# Patient Record
Sex: Female | Born: 1955 | Hispanic: No | Marital: Married | Smoking: Former smoker
Health system: Southern US, Community
[De-identification: ages and names within clinical notes are randomized; demographics above are authoritative.]

## PROBLEM LIST (undated history)

## (undated) DIAGNOSIS — E785 Hyperlipidemia, unspecified: Secondary | ICD-10-CM

## (undated) DIAGNOSIS — T7840XA Allergy, unspecified, initial encounter: Secondary | ICD-10-CM

## (undated) DIAGNOSIS — K219 Gastro-esophageal reflux disease without esophagitis: Secondary | ICD-10-CM

## (undated) DIAGNOSIS — M1990 Unspecified osteoarthritis, unspecified site: Secondary | ICD-10-CM

## (undated) DIAGNOSIS — M810 Age-related osteoporosis without current pathological fracture: Secondary | ICD-10-CM

## (undated) DIAGNOSIS — R319 Hematuria, unspecified: Secondary | ICD-10-CM

## (undated) DIAGNOSIS — J45909 Unspecified asthma, uncomplicated: Secondary | ICD-10-CM

## (undated) DIAGNOSIS — E559 Vitamin D deficiency, unspecified: Secondary | ICD-10-CM

## (undated) HISTORY — PX: MULTIPLE TOOTH EXTRACTIONS: SHX2053

## (undated) HISTORY — PX: EYE SURGERY: SHX253

## (undated) HISTORY — PX: HAMMER TOE SURGERY: SHX385

## (undated) HISTORY — DX: Unspecified asthma, uncomplicated: J45.909

## (undated) HISTORY — PX: BUNIONECTOMY: SHX129

## (undated) HISTORY — DX: Allergy, unspecified, initial encounter: T78.40XA

## (undated) HISTORY — PX: BREAST CYST EXCISION: SHX579

## (undated) HISTORY — DX: Gastro-esophageal reflux disease without esophagitis: K21.9

## (undated) HISTORY — DX: Unspecified osteoarthritis, unspecified site: M19.90

## (undated) HISTORY — PX: COLONOSCOPY: SHX174

## (undated) HISTORY — DX: Age-related osteoporosis without current pathological fracture: M81.0

## (undated) HISTORY — PX: DILATION AND CURETTAGE OF UTERUS: SHX78

## (undated) HISTORY — PX: RHINOPLASTY: SUR1284

## (undated) HISTORY — DX: Vitamin D deficiency, unspecified: E55.9

## (undated) HISTORY — DX: Hematuria, unspecified: R31.9

## (undated) HISTORY — PX: TONSILECTOMY/ADENOIDECTOMY WITH MYRINGOTOMY: SHX6125

## (undated) HISTORY — DX: Hyperlipidemia, unspecified: E78.5

## (undated) HISTORY — PX: POLYPECTOMY: SHX149

---

## 2009-08-25 ENCOUNTER — Encounter (INDEPENDENT_AMBULATORY_CARE_PROVIDER_SITE_OTHER): Payer: Self-pay | Admitting: *Deleted

## 2009-08-26 ENCOUNTER — Ambulatory Visit: Payer: Self-pay | Admitting: Gastroenterology

## 2009-09-01 ENCOUNTER — Telehealth: Payer: Self-pay | Admitting: Gastroenterology

## 2009-09-02 ENCOUNTER — Ambulatory Visit: Payer: Self-pay | Admitting: Gastroenterology

## 2009-09-04 ENCOUNTER — Encounter: Payer: Self-pay | Admitting: Gastroenterology

## 2009-09-25 ENCOUNTER — Telehealth: Payer: Self-pay | Admitting: Gastroenterology

## 2009-09-25 ENCOUNTER — Ambulatory Visit: Payer: Self-pay | Admitting: Gastroenterology

## 2009-09-25 LAB — CONVERTED CEMR LAB
Fecal Occult Blood: NEGATIVE
OCCULT 1: NEGATIVE
OCCULT 2: NEGATIVE
OCCULT 3: NEGATIVE

## 2009-09-29 ENCOUNTER — Encounter: Payer: Self-pay | Admitting: Gastroenterology

## 2010-04-02 ENCOUNTER — Encounter
Admission: RE | Admit: 2010-04-02 | Discharge: 2010-04-02 | Payer: Self-pay | Source: Home / Self Care | Attending: Family Medicine | Admitting: Family Medicine

## 2010-04-07 NOTE — Progress Notes (Signed)
Summary: prep ?'s  Phone Note Call from Patient Call back at [phone]   Caller: Patient Call For: Dr. Arlyce Dice Reason for Call: Talk to Nurse Summary of Call: prep?'s Initial call taken by: Vallarie Mare,  September 01, 2009 10:03 AM  Follow-up for Phone Call        Returned pts phone call.  She had questions as far as what she could eat after her procedure.  Discussed a non gassy, light diet with her.  Pt. verbalized understanding. Follow-up by: Jennye Boroughs RN,  September 01, 2009 10:10 AM

## 2010-04-07 NOTE — Letter (Signed)
Summary: [facility] Instructions  [facility]  [address] [place], [place] [address]   Phone: [phone]  Fax: [phone]       Connie Lozano    57-Nov-1957    MRN: [id number]        Procedure Day /Date: 09/02/09  Tuesday     Arrival Time:  10:30am      Procedure Time: 11:30am     Location of Procedure:                    _ x_   Endoscopy Center (4th Floor)                        PREPARATION FOR COLONOSCOPY WITH MOVIPREP   Starting 5 days prior to your procedure _ 6/23/11_ do not eat nuts, seeds, popcorn, corn, beans, peas,  salads, or any raw vegetables.  Do not take any fiber supplements (e.g. Metamucil, Citrucel, and Benefiber).  THE DAY BEFORE YOUR PROCEDURE         DATE:  09/01/09  DAY:  Monday  1.  Drink clear liquids the entire day-NO SOLID FOOD  2.  Do not drink anything colored red or purple.  Avoid juices with pulp.  No orange juice.  3.  Drink at least 64 oz. (8 glasses) of fluid/clear liquids during the day to prevent dehydration and help the prep work efficiently.  CLEAR LIQUIDS INCLUDE: Water Jello Ice Popsicles Tea (sugar ok, no milk/cream) Powdered fruit flavored drinks Coffee (sugar ok, no milk/cream) Gatorade Juice: apple, white grape, white cranberry  Lemonade Clear bullion, consomm, broth Carbonated beverages (any kind) Strained chicken noodle soup Hard Candy                             4.  In the morning, mix first dose of MoviPrep solution:    Empty 1 Pouch A and 1 Pouch B into the disposable container    Add lukewarm drinking water to the top line of the container. Mix to dissolve    Refrigerate (mixed solution should be used within 24 hrs)  5.  Begin drinking the prep at 5:00 p.m. The MoviPrep container is divided by 4 marks.   Every 15 minutes drink the solution down to the next mark (approximately 8 oz) until the full liter is complete.   6.  Follow completed prep with 16 oz of clear liquid of your choice  (Nothing red or purple).  Continue to drink clear liquids until bedtime.  7.  Before going to bed, mix second dose of MoviPrep solution:    Empty 1 Pouch A and 1 Pouch B into the disposable container    Add lukewarm drinking water to the top line of the container. Mix to dissolve    Refrigerate  THE DAY OF YOUR PROCEDURE      DATE:  09/02/09  DAY:  Tuesday  Beginning at  6:30 a.m. (5 hours before procedure):         1. Every 15 minutes, drink the solution down to the next mark (approx 8 oz) until the full liter is complete.  2. Follow completed prep with 16 oz. of clear liquid of your choice.    3. You may drink clear liquids until  9:30am (2 HOURS BEFORE PROCEDURE).   MEDICATION INSTRUCTIONS  Unless otherwise instructed, you should take regular prescription medications with a small sip of water  as early as possible the morning of your procedure.   Additional medication instructions: n/a         OTHER INSTRUCTIONS  You will need a responsible adult at least 57 years of age to accompany you and drive you home.   This person must remain in the waiting room during your procedure.  Wear loose fitting clothing that is easily removed.  Leave jewelry and other valuables at home.  However, you may wish to bring a book to read or  an iPod/MP3 player to listen to music as you wait for your procedure to start.  Remove all body piercing jewelry and leave at home.  Total time from sign-in until discharge is approximately 2-3 hours.  You should go home directly after your procedure and rest.  You can resume normal activities the  day after your procedure.  The day of your procedure you should not:   Drive   Make legal decisions   Operate machinery   Drink alcohol   Return to work  You will receive specific instructions about eating, activities and medications before you leave.    The above instructions have been reviewed and explained to me by   Sherren Kerns RN   August 26, 2009 1:41 PM     I fully understand and can verbalize these instructions _____________________________ Date _________

## 2010-04-07 NOTE — Letter (Signed)
Summary: Patient Notice-Hyperplastic Polyps  [facility]  [address] [place], [place] [address]   Phone: [phone]  Fax: [phone]        September 04, 2009 MRN: [id number]    [facility] [address] [place], [place]  [address]    Dear Ms. 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  These types of polyps are NOT pre-cancerous.  It is therefore my recommendation that you have a repeat colonoscopy examination in 10_ years for routine colorectal cancer screening.  Should you develop new or worsening symptoms of abdominal pain, bowel habit changes or bleeding from the rectum or bowels, please schedule an evaluation with either your primary care physician or with me.  Additional information/recommendations:  __No further action with gastroenterology is needed at this time.      Please follow-up with your primary care physician for your other      healthcare needs. __Please call [phone] to schedule a return visit to review      your situation.  __Please keep your follow-up visit as already scheduled.  _x_Continue treatment plan as outlined the day of your exam.  My office will mail you hemeoccult cards to followup on your previous test.  Please call us if you are having persistent problems or have questions about your condition that have not been fully answered at this time.  Sincerely,  Louis Meckel MD This letter has been electronically signed by your physician.  Appended Document: Patient Notice-Hyperplastic Polyps letter mailed 7.6.11

## 2010-04-07 NOTE — Letter (Signed)
Summary: Results Letter  [facility]  [address] [place], [place] [address]   Phone: [phone]  Fax: [phone]        September 29, 2009 MRN: [id number]    [facility] [address] [place], [place]  [address]    Dear Ms. Kidane,  Your hemeoccult cards testing for microscopic bleeding were negative.  No further GI workup is required at this time.   Please continue with the recommendations that we previously discussed. Should you have any further questions or concerns, feel free to contact me.    Sincerely,  Barbette Hair. Arlyce Dice, M.D., [facility]          Sincerely,  Louis Meckel MD  This letter has been electronically signed by your physician.  Appended Document: Results Letter Letter mailed to patient.

## 2010-04-07 NOTE — Progress Notes (Signed)
Summary: Hemoccult Results  Phone Note Call from Patient Call back at Home Phone [phone] Call back at or cell [phone] try home first   Call For: Dr Arlyce Dice Reason for Call: Lab or Test Results Summary of Call: Says she dropped of a stool sample to lab day before yesterday and they told her we would get results the very next day. Is concerned she has not heard anything back yet. Initial call taken by: Leanor Kail [facility],  September 25, 2009 1:00 PM  Follow-up for Phone Call        Pt. advised I will call her when lab has results. Follow-up by: Laureen Ochs LPN,  September 25, 2009 2:31 PM  Additional Follow-up for Phone Call Additional follow up Details #1::        Hemocults are all negative, pt. aware I will call if there are any new orders after Dr.Kaplan reviews. Pt. instructed to call back as needed.  Additional Follow-up by: Laureen Ochs LPN,  September 26, 2009 8:27 AM

## 2010-04-07 NOTE — Procedures (Signed)
Summary: Colonoscopy  Patient: Connie Lozano Note: All result statuses are Final unless otherwise noted.  Tests: (1) Colonoscopy (COL)   COL Colonoscopy           DONE     [facility]     [address]     [place], [place]  [address]           COLONOSCOPY PROCEDURE REPORT           PATIENT:  Connie, Lozano  MR#:  [id number]     BIRTHDATE:  May 16, 1955, 53 yrs. old  GENDER:  female           ENDOSCOPIST:  Barbette Hair. Arlyce Dice, MD     Referred by:           PROCEDURE DATE:  09/02/2009     PROCEDURE:  Colonoscopy with biopsy, Colonoscopy with snare     polypectomy     ASA CLASS:  Class I     INDICATIONS:  1) heme positive stool           MEDICATIONS:   Fentanyl 75 mcg IV, Versed 8 mg IV           DESCRIPTION OF PROCEDURE:   After the risks benefits and     alternatives of the procedure were thoroughly explained, informed     consent was obtained.  Digital rectal exam was performed and     revealed no abnormalities.   The LB [id number]-[id number] endoscope     was introduced through the anus and advanced to the cecum, which     was identified by both the appendix and ileocecal valve, without     limitations.  The quality of the prep was excellent, using     MoviPrep.  The instrument was then slowly withdrawn as the colon     was fully examined.     <<PROCEDUREIMAGES>>           FINDINGS:  A sessile polyp was found in the rectum. It was 3 mm in     size. It was found 2 cm from the point of entry. Polyp was snared     without cautery. Retrieval was successful (see image11). snare     polyp.   Possible colitis was found in the rectum. Inflamed mucosa     involving 2-3cm of rectal vault (see image12). Bxs taken to r/o     procitis vs mucosal prolase  This was otherwise a normal     examination of the colon (see image3, image4, image5, image7, and     image9).   Retroflexed views in the rectum revealed no     abnormalities.    The time to cecum =  6.25  minutes. The scope  was then withdrawn (time =  8.0  min) from the patient and the     procedure completed.           COMPLICATIONS:  None           ENDOSCOPIC IMPRESSION:     1) 3 mm sessile polyp in the rectum     2) Possible colitis in the rectum     3) Otherwise normal examination     RECOMMENDATIONS:     1) Await pathology results           Findings in rectal vault may explain heme positive stool           REPEAT EXAM:   You will receive a letter  from Dr. Arlyce Dice in 1-2     weeks, after reviewing the final pathology, with followup     recommendations.           ______________________________     Barbette Hair Arlyce Dice, MD           cc: Patria Mane, R.N.     [address], [address]     [place], [place] [address]           CC:           n.     eSIGNED:   Barbette Hair. Kaplan at 09/02/2009 12:35 PM           Connie, Lozano, [id number]  Note: An exclamation mark (!) indicates a result that was not dispersed into the flowsheet. Document Creation Date: 09/02/2009 12:35 PM _______________________________________________________________________  (1) Order result status: Final Collection or observation date-time: 09/02/2009 12:27 Requested date-time:  Receipt date-time:  Reported date-time:  Referring Physician:   Ordering Physician: Melvia Heaps [phone]) Specimen Source:  Source: Launa Grill Order Number: [id number] Lab site:   Appended Document: Colonoscopy     Procedures Next Due Date:    Colonoscopy: 09/2019

## 2010-04-07 NOTE — Miscellaneous (Signed)
Summary: previsit/rm  Clinical Lists Changes  Medications: Added new medication of MOVIPREP 100 GM  SOLR (PEG-KCL-NACL-NASULF-NA ASC-C) As per prep instructions. - Signed Rx of MOVIPREP 100 GM  SOLR (PEG-KCL-NACL-NASULF-NA ASC-C) As per prep instructions.;  #1 x 0;  Signed;  Entered by: Sherren Kerns RN;  Authorized by: Louis Meckel MD;  Method used: Electronically to [facility]. [place]. [phone]*, [address] [place], [place], [place]  [address], Ph: [phone] or [phone], Fax: [phone] Observations: Added new observation of ALLERGY REV: Done (08/26/2009 13:07) Added new observation of NKA: T (08/26/2009 13:07)    Prescriptions: MOVIPREP 100 GM  SOLR (PEG-KCL-NACL-NASULF-NA ASC-C) As per prep instructions.  #1 x 0   Entered by:   Sherren Kerns RN   Authorized by:   Louis Meckel MD   Signed by:   Sherren Kerns RN on 08/26/2009   Method used:   Electronically to        [facility]. [address] [phone]* (retail)       [address]       [place], [place]  [address]       Ph: [phone] or [phone]       Fax: [phone]   RxID:   [id number]

## 2011-05-28 ENCOUNTER — Other Ambulatory Visit: Payer: Self-pay | Admitting: Family Medicine

## 2011-05-28 DIAGNOSIS — Z1231 Encounter for screening mammogram for malignant neoplasm of breast: Secondary | ICD-10-CM

## 2011-06-08 ENCOUNTER — Ambulatory Visit
Admission: RE | Admit: 2011-06-08 | Discharge: 2011-06-08 | Disposition: A | Discharge status: Home / Self Care | Payer: BC Managed Care – PPO | Source: Ambulatory Visit | Attending: Family Medicine | Admitting: Family Medicine

## 2011-06-08 DIAGNOSIS — Z1231 Encounter for screening mammogram for malignant neoplasm of breast: Secondary | ICD-10-CM

## 2011-08-18 ENCOUNTER — Other Ambulatory Visit: Payer: Self-pay | Admitting: Lactation Services

## 2011-08-18 ENCOUNTER — Ambulatory Visit
Admission: RE | Admit: 2011-08-18 | Discharge: 2011-08-18 | Disposition: A | Discharge status: Home / Self Care | Payer: BC Managed Care – PPO | Source: Ambulatory Visit | Attending: Family Medicine | Admitting: Family Medicine

## 2011-08-18 DIAGNOSIS — J189 Pneumonia, unspecified organism: Secondary | ICD-10-CM

## 2012-01-19 ENCOUNTER — Ambulatory Visit
Admission: RE | Admit: 2012-01-19 | Discharge: 2012-01-19 | Disposition: A | Discharge status: Home / Self Care | Payer: BC Managed Care – PPO | Source: Ambulatory Visit | Attending: Family Medicine | Admitting: Family Medicine

## 2012-01-19 ENCOUNTER — Other Ambulatory Visit: Payer: Self-pay | Admitting: Family Medicine

## 2012-01-19 DIAGNOSIS — M542 Cervicalgia: Secondary | ICD-10-CM

## 2012-01-19 DIAGNOSIS — R200 Anesthesia of skin: Secondary | ICD-10-CM

## 2012-03-27 ENCOUNTER — Other Ambulatory Visit (HOSPITAL_COMMUNITY)
Admission: RE | Admit: 2012-03-27 | Discharge: 2012-03-27 | Disposition: A | Discharge status: Home / Self Care | Payer: BC Managed Care – PPO | Source: Ambulatory Visit | Attending: Family Medicine | Admitting: Family Medicine

## 2012-03-27 ENCOUNTER — Other Ambulatory Visit: Payer: Self-pay | Admitting: Family Medicine

## 2012-03-27 DIAGNOSIS — Z124 Encounter for screening for malignant neoplasm of cervix: Secondary | ICD-10-CM | POA: Insufficient documentation

## 2012-06-26 ENCOUNTER — Other Ambulatory Visit: Payer: Self-pay

## 2012-06-26 DIAGNOSIS — Z1231 Encounter for screening mammogram for malignant neoplasm of breast: Secondary | ICD-10-CM

## 2012-07-18 ENCOUNTER — Other Ambulatory Visit: Payer: Self-pay | Admitting: Dermatology

## 2012-07-25 ENCOUNTER — Ambulatory Visit
Admission: RE | Admit: 2012-07-25 | Discharge: 2012-07-25 | Disposition: A | Discharge status: Home / Self Care | Payer: BC Managed Care – PPO | Source: Ambulatory Visit

## 2012-07-25 DIAGNOSIS — Z1231 Encounter for screening mammogram for malignant neoplasm of breast: Secondary | ICD-10-CM

## 2012-08-16 ENCOUNTER — Other Ambulatory Visit: Payer: Self-pay | Admitting: Dermatology

## 2012-09-19 ENCOUNTER — Other Ambulatory Visit: Payer: Self-pay | Admitting: Dermatology

## 2013-03-30 ENCOUNTER — Other Ambulatory Visit (HOSPITAL_COMMUNITY)
Admission: RE | Admit: 2013-03-30 | Discharge: 2013-03-30 | Disposition: A | Discharge status: Home / Self Care | Payer: BC Managed Care – PPO | Source: Ambulatory Visit | Attending: Family Medicine | Admitting: Family Medicine

## 2013-03-30 ENCOUNTER — Other Ambulatory Visit: Payer: Self-pay | Admitting: Family Medicine

## 2013-03-30 DIAGNOSIS — Z124 Encounter for screening for malignant neoplasm of cervix: Secondary | ICD-10-CM | POA: Insufficient documentation

## 2013-03-30 DIAGNOSIS — Z1151 Encounter for screening for human papillomavirus (HPV): Secondary | ICD-10-CM | POA: Insufficient documentation

## 2013-06-21 ENCOUNTER — Other Ambulatory Visit: Payer: Self-pay

## 2013-06-21 DIAGNOSIS — Z1231 Encounter for screening mammogram for malignant neoplasm of breast: Secondary | ICD-10-CM

## 2013-06-22 ENCOUNTER — Ambulatory Visit: Payer: Self-pay | Admitting: Family Medicine

## 2013-06-25 ENCOUNTER — Ambulatory Visit: Payer: Self-pay | Admitting: Family Medicine

## 2013-06-25 ENCOUNTER — Ambulatory Visit: Payer: BC Managed Care – PPO | Admitting: Family Medicine

## 2013-08-22 ENCOUNTER — Ambulatory Visit
Admission: RE | Admit: 2013-08-22 | Discharge: 2013-08-22 | Disposition: A | Discharge status: Home / Self Care | Payer: BC Managed Care – PPO | Source: Ambulatory Visit

## 2013-08-22 ENCOUNTER — Encounter (INDEPENDENT_AMBULATORY_CARE_PROVIDER_SITE_OTHER): Payer: Self-pay

## 2013-08-22 DIAGNOSIS — Z1231 Encounter for screening mammogram for malignant neoplasm of breast: Secondary | ICD-10-CM

## 2013-08-24 ENCOUNTER — Encounter: Payer: Self-pay | Admitting: Family Medicine

## 2013-08-24 ENCOUNTER — Ambulatory Visit (INDEPENDENT_AMBULATORY_CARE_PROVIDER_SITE_OTHER): Payer: BC Managed Care – PPO | Admitting: Family Medicine

## 2013-08-24 VITALS — BP 131/85 | Ht 62.0 in

## 2013-08-24 DIAGNOSIS — M545 Low back pain, unspecified: Secondary | ICD-10-CM | POA: Insufficient documentation

## 2013-08-24 DIAGNOSIS — M810 Age-related osteoporosis without current pathological fracture: Secondary | ICD-10-CM | POA: Insufficient documentation

## 2013-08-24 NOTE — Progress Notes (Signed)
Subjective:  Patient presents today as a new patient to discuss chronic mild low back pain with activity.   Low Back Pain Patient's main concern today is to find exercises that will not cause severe injury given that she has osteoporosis and is concerned may have some osteoarthritis.  Location- lumbar spine Quality-aching pain Severity- mild less than 4/10 at worst Duration- x 10 years Timing- denies history of trauma Context- Patient is very active. She participates in pilates, jogging, lifting weights, jogging typically 3x a week in summer and 1-3x a week in school year. . SHe usually has pain after prolonged activity. IT usually resolves with rest.  Modifying factors- feels stiff in morning or if sitting for long period. <5 minutes of stiffness with moving. Pain can be aggravated by long periods of sitting as well Previous treatments-does not want to take medication for this, has seen a chiropractor or tried ice/heat. She admits to avoiding back extension exercises for fear of damage to osteoporosis.   ROS- no fever/chills/unintentional weight loss/fatigue. No saddle anesthesia, bladder incontinence, fecal incontinence, weakness in extremity, numbness or tingling in extremity. History negative for trauma, history of cancer, fever, chills, unintentional weight loss, recent bacterial infection, recent IV drug use, HIV, pain worse at night or while supine. She does occasionally have some pain above right illiac spine after at least a mile of walking but also relieves with rest going on for 2 months. Also has some history of trapezius strain for which she saw a chiropractor  The following were reviewed and entered/updated in epic: Past Medical History  Diagnosis Date  . Osteoporosis    Past Surgical History  Procedure Laterality Date  . Eye surgery      strabismus  . Tonsilectomy/adenoidectomy with myringotomy    . Hammer toe surgery    . Bunionectomy     Medications- reviewed and  updated Current Outpatient Prescriptions  Medication Sig Dispense Refill  . acyclovir ointment (ZOVIRAX) 5 % Apply 1 application topically every 3 (three) hours.      . Calcium-Vitamin D-Vitamin K [id number] MG-UNT-MCG CHEW Chew by mouth.      . fluticasone (FLONASE) 50 MCG/ACT nasal spray Place into both nostrils daily.      . hydrocortisone (ANUSOL-HC) 25 MG suppository Place 25 mg rectally 2 (two) times daily.      Marland Kitchen ibuprofen (ADVIL,MOTRIN) 200 MG tablet Take 200 mg by mouth every 6 (six) hours as needed.      . loratadine (CLARITIN) 10 MG tablet Take 10 mg by mouth daily.      . Lubricants (ASTROGLIDE) GEL Apply topically.      . ondansetron (ZOFRAN) 4 MG tablet Take 4 mg by mouth every 8 (eight) hours as needed for nausea or vomiting.      . Salicylic Acid (COMPOUND W EX) Apply topically.      . Sinecatechins (VEREGEN) 15 % OINT Apply topically.      . Vitamin D, Ergocalciferol, (DRISDOL) 50000 UNITS CAPS capsule Take 50,000 Units by mouth every 7 (seven) days.       No current facility-administered medications for this visit.    Allergies-reviewed and updated Allergies no known allergies  History   Social History  . Marital Status: Married    Spouse Name: N/A    Number of Children: N/A  . Years of Education: N/A   Social History Main Topics  . Smoking status: Former Smoker -- 0.50 packs/day for 30 years  . Smokeless tobacco: None  .  Alcohol Use: 0.6 oz/week    1 Glasses of wine per week  . Drug Use: None  . Sexual Activity: None   Other Topics Concern  . None   Social History Narrative   Works as Multimedia programmer in [employer]   ROS--See HPI   Objective: BP 131/85  Ht 5\' 2"  (1.575 m) Gen: thin female appears younger than stated age, NAD  Neck: Cannot reproduce tenderness Negative spurlings  Bilateral Hip: ROM IR: 80 Deg, ER: 80 Deg, Flexion: 120 Deg, Extension: 100 Deg, Abduction: 45 Deg, Adduction: 45 Deg Strength IR: 5/5, ER: 5/5, Flexion: 5/5,  Extension: 5/5, Abduction: 4+/5, Adduction: 5/5 Pelvic alignment unremarkable to inspection and palpation. Standing hip rotation and gait without trendelenburg / unsteadiness. Greater trochanter without tenderness to palpation. No tenderness over piriformis and greater trochanter. No SI joint tenderness and normal minimal SI movement. No abnormalities of gait noted   Back - Normal skin, Spine with normal alignment and no deformity.  No tenderness to vertebral process palpation.  Paraspinous muscles are not tender and without spasm.   Range of motion is full at neck and lumbar sacral regions. Negative straight leg raise.   Assessment/Plan:  Low back pain Nonradicular. Patient with mild low back pain that she wants to improve without aid of medication. Her main concern was causing injury due to her osteoporosis-she was advised to avoid high impact or very high weight exercises. Did encourage weight bearing exercises. Advised no restrictions unless something causes her obvious uncomfortable pain (beyond what you would expect working out). DId advise of a core strengthening program specifically with supermans to counteract typical flexion of back in our lifestyle (can use other exercises as well and also discussed weak hip abductors which she can work on). Patient also requests advise on someone in area for manipulation. Patient was given contact information for Charlann Boxer, DO of [place] who she will seek care for her neck and low back. She was advised to return to [place] prn.    Dr. Nori Riis has seen and evaluated the patient independently. We have discussed the history, exam, assessment, and plan as noted above. She agrees with management.   Brayton Mars. Melanee Spry, MD, PGY3 08/24/2013 3:18 PM

## 2013-08-24 NOTE — Assessment & Plan Note (Signed)
Nonradicular. Patient with mild low back pain that she wants to improve without aid of medication. Her main concern was causing injury due to her osteoporosis-she was advised to avoid high impact or very high weight exercises. Did encourage weight bearing exercises. Advised no restrictions unless something causes her obvious uncomfortable pain (beyond what you would expect working out). DId advise of a core strengthening program specifically with supermans to counteract typical flexion of back in our lifestyle (can use other exercises as well and also discussed weak hip abductors which she can work on). Patient also requests advise on someone in area for manipulation. Patient was given contact information for Charlann Boxer, DO of [place] who she will seek care for her neck and low back. She was advised to return to [place] prn.

## 2013-08-28 NOTE — Progress Notes (Signed)
[facility] Attending Note: I have seen and examined this patient. I have discussed this patient with the resident and reviewed the assessment and plan as documented above. I agree with the resident's findings and plan.  

## 2014-07-08 ENCOUNTER — Encounter: Payer: Self-pay | Admitting: Gastroenterology

## 2014-07-23 ENCOUNTER — Other Ambulatory Visit: Payer: Self-pay

## 2014-07-23 DIAGNOSIS — Z1231 Encounter for screening mammogram for malignant neoplasm of breast: Secondary | ICD-10-CM

## 2014-08-27 ENCOUNTER — Ambulatory Visit: Payer: Self-pay

## 2014-10-01 ENCOUNTER — Ambulatory Visit
Admission: RE | Admit: 2014-10-01 | Discharge: 2014-10-01 | Disposition: A | Discharge status: Home / Self Care | Payer: BLUE CROSS/BLUE SHIELD | Source: Ambulatory Visit

## 2014-10-01 DIAGNOSIS — Z1231 Encounter for screening mammogram for malignant neoplasm of breast: Secondary | ICD-10-CM

## 2015-03-11 ENCOUNTER — Ambulatory Visit: Payer: BLUE CROSS/BLUE SHIELD

## 2015-03-20 ENCOUNTER — Encounter: Payer: Self-pay | Admitting: Physical Therapy

## 2015-03-20 ENCOUNTER — Ambulatory Visit: Payer: BLUE CROSS/BLUE SHIELD | Attending: Internal Medicine | Admitting: Physical Therapy

## 2015-03-20 DIAGNOSIS — R531 Weakness: Secondary | ICD-10-CM | POA: Diagnosis present

## 2015-03-20 DIAGNOSIS — M545 Low back pain, unspecified: Secondary | ICD-10-CM

## 2015-03-20 NOTE — Patient Instructions (Addendum)
Osteoporosis   What is Osteoporosis?  - A silent disease in which the skeleton is weakened by decreased bone density. - Characterized by low bone mass, deterioration of bone, and increased risk of fracture postmenopausal (primary) or the result of an identifiable condition/event (secondary) - Commonly found in the wrists, spine, and hips; these are high-risk stress areas and very susceptible to fractures.  The Facts: - There are 1.5 million fractures/year o 500,000 spine; 250,000 hip with over 60,000 nursing home admissions secondary to hip fracture; and 200,000 wrist - After hip fracture, only 50% of people able to walk independently prior to the fracture return to independent ambulation. - Bone mass: Peaks at age 8-30, and begins declining at age 64-50.   Osteoporosis is defined by the [facility]) as:  NOF/WHO Criteria for Interpreting Results of Bone Density Assessment  Results Diagnosis  Within 1 standard deviation (SD) of young adult mean Normal  Between 1 and -2.5 SD below mean, repeat in 2 years Low bone mass (osteopenia)  Greater than -2.5 SD below mean Osteoporosis  Greater than -2.5 SD below mean and one or more fragility fractures exist Severe Osteoporosis  *Results can be affected by positioning of the body in the DEXA scan, presence of current or old fractures, arthritis, extraneous calcifications.   Hips -2.3, and L1 -4.1 Osteoporosis is not just a women's disease!  - 30-40% of women will develop osteoporosis - 5-15% of males will develop osteoporosis   What are the risk factors?  1. Female 2. Thin, small frame 3. Caucasian, Asian race 4. Early menopause (<86 years old)/amenorrhea/delayed puberty 33. Old age 27. Family history (fractures, stooped posture)\ 7. Low calcium diet 8. Sedentary lifestyle 9. Alcohol, Caffeine, Smoking 10. Malnutrition, GI Disease 11. Prolonged use of Glucocorticoids (Prednisone), Meds to treat asthma,  arthritis, cancers, thyroid, and anti-seizure meds.  How do I know for sure?  Get a BONE DENSITY TEST!  This measures bone loss and it's painless, non-invasive, and only takes 5-10 minutes!  What can I do about it?  ? Decrease your risk factors (alcohol, caffeine, smoking) ? Helpful medications (see next page) ? Adequate Calcium and Vitamin D intake ? Get active! o Proper posture - Sit and stand tall! No slouching or twisting o Weight-Bearing Exercise - walking, stair climbing, elliptical; NO jogging or high-impact exercise. o Resistive Exercise - Cybex weight equipment, Nautilus, dumbbells, therabands  **Be sure to maintain proper alignment when lifting any weight!!  **When using equipment, avoid abdominal exercises which involve "crunching" or curling or twisting the trunk, biceps machines, cross-country machines, moving handlebars, or ANY MACHINE WITH ROTATION OR FORWARD BENDING!!!           Approved Pharmacologic Management of Osteoporosis  Agent Approved for prevention Approved for treatment BMD increased spine/hip Fracture reduction  Estrogen/Hormone Therapy (Estrace, Estratab, Ogen, Premarin, Vivell, Prempro, Femhert, Orthoest) Yes Yes 3-6% 35% spine and hip  Bisphosphonates  (Fosamax, Actonel, Boniva) Yes Yes 3-8% 35-50% spine and non-spine  Calcitonin (Miacalcin, Calcimar, Fortical) No Yes 0-3% None stated  Raloxifene (Evista) Yes Yes 2-3% 30-55%  Parathyroid Hormone (Forteo) No Yes, only in those at high risk for fracture None stated 53-65%     Recommended Daily Calcium Intakes   Population Group NIH/NOF* (mg elemental calcium)  Children 1-10 years [phone]  Children 11-24 years 73-1500  Men and Women 25-64 years At least 1200  Pregnant/Lactating At least 1200  Postmenopausal women with hormone replacement therapy At least 1200  Postmenopausal  women without   hormone replacement therapy At least 1200  Men and women 65 + At least 1200  *In 1987,  1990, 1994, and 2000, the NIH held consensus conferences on osteoporosis and calcium.  This column shows the most recent recommendations regarding calcium intak for preventing and managing osteoporosis.          Calcium Content of Selected Foods  Dairy Foods Calcium Content (mg) Non-Dairy Foods Calcium Content (mg)  Buttermilk, 1 cup 300 Calcium-fortified juice, 1 cup 300  Milk, 1 cup 300 Salmon, canned with Bones, 2 oz 100  Lactaid milk, 1 cup 300-500 Oysters, raw 13-19 medium 226  Soy milk, 1 cup 200-300 Sardines, canned with bones, 3 oz 372  Yogurt (plain, lowfat) 1 cup 250-300 Shrimp, canned 3 oz 98  Frozen yogurt (fruit) 1 cup 200-600 Collard greens, cooked 1 cup 357  Cheddar, mozzarella, or Muenster cheese, 1oz 205 Broccoli, cooked 1 cup 78  Cottage cheese (lowfat) 4 oz 200 Soybeans, cooked 1 cup 131  Part-skim ricotta cheese, 4oz 335 Tofu, 4oz* *  Vanilla ice cream, 1 cup 120-300    *Calcium content of tofu varies depending on processing method; check nutritional label on package for precise calcium content.     Suggested Guidelines for Calcium Supplement Use:  ? Calcium is absorbed most efficiently if taken in small amounts throughout the day.  Always divide the daily dose into smaller amounts if the total daily dose is 500mg  or more per day.  The body cannot use more than 500mg  Calcium at any one time. ? The use of manufactured supplements is encouraged.  Calcium as bone meal or dolomite may contain lead or other heavy metals as contaminants. ? Calcium supplements should not be taken with high fiber meals or with bulk forming laxatives. ? If calcium carbonate is used as the supplement form, it should be taken with meals to assure that stomach acid production is present to facilitate optimal dissolution and absorption of calcium.  This is important if atrophic gastritis with hypo- or achlorhydria is present, which it is in 20-50% of older individuals. ? It is  important to drink plenty of fluids while using the supplement to help reduce problems with side effects like constipation or bloating.  If these symptoms become a problem, switching to another form of supplement may be the answer. ? Another alternative is calcium-fortified foods, including fruit juices, cereals, and breads.  These foods are now marketed with added calcium and may be less likely to cause side effects. ? Those with personal or family histories of kidney stones should be monitored to assure that hypercalcuria does not occur. CALCIUM INTAKE QUIZ  Dairy products are the primary source of calcium for most people.  For a quick estimate of your daily calcium intake, complete the following steps:  1. Use the chart below to determine your daily intake of calcium from diary foods. Servings of dairy per day 1 2 3 4 5 6 7 8   Milligrams (mg) of calcium: 250 [phone] 1250 1500 1750 2000   2.  Enter your total daily calcium intake from dairy foods:     _____mg  3.  Add 350 mg, which is the average for all other dietary sources:                 +            350 mg  4.  The sum of your total daily calcium intake:  ______mg  5.  Enter the recommended calcium intake for your age from the chart below;         ______mg  6.  Enter your daily intake from step 4 above and subtract:                             -        _______mg  7.  The result is how much additional calcium you need:                                          ______mg      Recommended Daily Calcium Intake  Population Calcium (mg)  Children 1-10 years [phone]  Children 11-24 years 41-1500  Men and women 25-64 At least 1200  Pregnant/Lactating At least 1200  Postmenopausal women with hormone replacement therapy At least 1200  Postmenopausal women without hormone replacement therapy At least 1200  Men and women 65+ At least 1200       SAFETY TIPS FOR FALL PREVENTION   1. Remove throw rugs and make  certain carpet edges are securely fastened to the floor.  2. Reduce clutter, especially in traffic areas of the home.   3. Install/maintain sturdy handrails at stairs.  4. Increase wattage of lighting in hallways, bathrooms, kitchens, stairwells, and entrances to home.  5. Use night-lights near bed, in hallways, and in bathroom to improve night safety.  6. Install safety handrails in shower, tub, and around toilet.  Bathtubs and shower stalls should have non-skid surfaces.  7. When you must reach for something high, use a safety step stool, one with wide steps and a friction surface to stand on.  A type equipped with a high handrail is preferred.  8. If a cane or other walking aid has been recommended, use it to help increase your stability.  9. Wear supportive, cushioned, low-heeled shoes.  Avoid "scuffs" (backless bedroom slippers) and high heels.  10. Avoid rushing to answer a phone, doorbell, or anything else!  A portable phone that you can take from room to room with you is a good idea for security and safety.  11. Exercise regularly and stay active!!    [place] [employer] www.NOF.org   Exercise for Osteoporosis; A Safe and Effective Way to Build Bone Density and Muscle Strength By: Burnard Hawthorne, M.A.                                            DO's and DON'T's   Avoid and/or Minimize positions of forward bending ( flexion)  Side bending and rotation of the trunk  Especially when movements occur together   When your back aches:   Don't sit down   Lie down on your back with a small pillow under your head and one under your knees or as outlined by our therapist. Or, lie in the 90/90 position ( on the floor with your feet and legs on the sofa with knees and hips bent to 90 degrees)  Tying or putting on your shoes:   Don't bend over to tie your shoes or put on socks.  Instead, bring one foot up, cross it over the opposite knee and bend  forward (hinge) at the hips to so the task.  Keep your back straight.  If you cannot do this safely, then you need to use long handled assistive devices such as a shoehorn and sock puller.  Exercising:  Don't engage in ballistic types of exercise routines such as high-impact aerobics or jumping rope  Don't do exercises in the gym that bring you forward (abdominal crunches, sit-ups, touching your  toes, knee-to-chest, straight leg raising.)  Follow a regular exercise program that includes a variety of different weight-bearing activities, such as low-impact aerobics, T' ai chi or walking as your physical therapist advises  Do exercises that emphasize return to normal body alignment and strengthening of the muscles that keep your back straight, as outlined in this program or by your therapist  Household tasks:  Don't reach unnecessarily or twist your trunk when mopping, sweeping, vacuuming, raking, making beds, weeding gardens, getting objects ou of cupboards, etc.  Keep your broom, mop, vacuum, or rake close to you and mover your whole body as you move them. Walk over to the area on which you are working. Arrange kitchen, bathroom, and bedroom shelves so that frequently used items may be reached without excessive bending, twisting, and reaching.  Use a sturdy stool if necessary.  Don't bend from the waist to pick up something up  Off the floor, out of the trunk of your car, or to brush your teeth, wash your face, etc.   Bend at the knees, keeping back straight as possible. Use a reacher if necessary.   Prevention of fracture is the so-called "BOTTOm -Line" in the management of OSTEOPOROSIS. Do not take unnecessary chances in movement. Once a compression fracture occurs, the process is very difficult to control; one fracture is frequently followed by many more.  [facility] [address], [place], [place] [address] Phone # [phone] Fax [phone]

## 2015-03-20 NOTE — Therapy (Signed)
[facility] [address] [place], [place], [address] Phone: [phone]   Fax:  [phone]  Physical Therapy Treatment  Patient Details  Name: Connie Lozano MRN: [id number] Date of Birth: Feb 14, 1956 Referring Provider: Dr. Delrae Rend  Encounter Date: 03/20/2015      PT End of Session - 03/20/15 1631    Visit Number 1   Date for PT Re-Evaluation 05/15/15   PT Start Time 1530   PT Stop Time 1615   PT Time Calculation (min) 45 min   Activity Tolerance Patient limited by pain   Behavior During Therapy [facility] for tasks assessed/performed      Past Medical History  Diagnosis Date  . Osteoporosis     Past Surgical History  Procedure Laterality Date  . Eye surgery      strabismus  . Tonsilectomy/adenoidectomy with myringotomy    . Hammer toe surgery    . Bunionectomy      There were no vitals filed for this visit.  Visit Diagnosis:  Bilateral low back pain without sciatica - Plan: PT plan of care cert/re-cert  Weakness - Plan: PT plan of care cert/re-cert      Subjective Assessment - 03/20/15 1541    Subjective Bone Density shows L1 is -4.1, femural neck -2.4 .  Patient wants to learn what she is to do for osteoporosis, how to manage it, work with back pain. Patient has had back pain for 10 years that has become progressively worse.  Patient was diagnosed with osteoporsis 7 years ago.    Patient Stated Goals education of osteoporosis   Currently in Pain? Yes   Pain Score 5    Pain Location Back   Pain Orientation Mid   Pain Descriptors / Indicators Sore   Pain Type Chronic pain   Pain Onset More than a month ago   Pain Frequency Intermittent   Aggravating Factors  vacuuming, moving trunk back and forth, lifting items   Pain Relieving Factors heat, stretching   Multiple Pain Sites No            [facility] PT Assessment - 03/20/15 0001    Assessment   Medical Diagnosis osteoporosis   Referring  Provider Dr. Delrae Rend   Onset Date/Surgical Date 03/08/08   Prior Therapy None   Balance Screen   Has the patient fallen in the past 6 months No   Has the patient had a decrease in activity level because of a fear of falling?  No   Is the patient reluctant to leave their home because of a fear of falling?  No   Prior Function   Level of Independence Independent   Vocation Full time employment   Vocation Requirements standing, bending over   Cognition   Overall Cognitive Status Within Functional Limits for tasks assessed   Observation/Other Assessments   Focus on Therapeutic Outcomes (FOTO)  34% limitation CJ   Posture/Postural Control   Posture Comments Height is 60 inches   ROM / Strength   AROM / PROM / Strength AROM;Strength   AROM   Lumbar Extension decreased by 25%   Lumbar - Right Side Bend decreased by 25%   Lumbar - Left Side Bend decreased by 25%   Strength   Overall Strength Comments bil. hip strength is 4/5. bil. shoulder strength 4/5, abdominal strength is 3/5   Standardized Balance Assessment   Five times sit to stand comments  13 seconds  PT Education - 03/20/15 1705    Education provided Yes   Education Details information on osteoporosis for do's and don'ts   Person(s) Educated Patient   Methods Explanation;Handout   Comprehension Verbalized understanding          PT Short Term Goals - 03/20/15 1708    PT SHORT TERM GOAL #1   Title understand how to manage osteoporosis to decrease pain and risk of fractures   Time 4   Period Weeks   Status New   PT SHORT TERM GOAL #2   Title independent with initial HEP   Time 4   Period Weeks   Status New   PT SHORT TERM GOAL #3   Title understand correct body mechanics with daily tasks to decrease fracture risk   Time 4   Period Weeks   Status New           PT Long Term Goals - 03/20/15 1710    PT LONG TERM GOAL #1   Title independent with HEP that  includes working out at the gym   Time 8   Period Weeks   Status New   PT LONG TERM GOAL #2   Title lumbar pain with daily tasks decreased >/= 50% due to increased lumbar strength   Time 8   Period Weeks   Status New   PT LONG TERM GOAL #3   Title work with back pain decreased >/= 50% due to improved strength for hips and shoulders >/= 4+/5   Time 8   Period Weeks   Status New   PT LONG TERM GOAL #4   Title understand ways to exercise to increase bone mass without putting strain on lumbar spine   Time 8   Period Weeks   Status New               Plan - 03/20/15 1613    Clinical Impression Statement Patient is a 60 year old with diagnosis of osteoporosis and medicare. Patient has had back pain for 10 years and diagnosis of osteoporosis is 7 years.  Patients t-score is -4.1 for L1 and hips are -2.3.  Patient reports her back pain 5/10 with  forward movement and laying on her back.  Patient bil. shoulder and hip strength is 4/5.  Patient height is 60 inches.  FOTO score is 34% limitation. Patient does not know how to exercise or manage her pain  correctly to prevent any fractures.  Patient does not understand how to exercise to increase her bone mass. Patient would benefit from physical therapy to reduce back pain, edcation, and how to exercies correctly.    Pt will benefit from skilled therapeutic intervention in order to improve on the following deficits Pain;Decreased strength;Decreased endurance   Rehab Potential Excellent   Clinical Impairments Affecting Rehab Potential None   PT Frequency 1x / week   PT Duration 8 weeks   PT Treatment/Interventions Therapeutic activities;Therapeutic exercise;Moist Heat;Electrical Stimulation;Cryotherapy;Ultrasound;Manual techniques;Patient/family education;Neuromuscular re-education   PT Next Visit Plan decompression exercises, correct posture and body mechanics with daily tasks   PT Home Exercise Plan decompression exercises   Recommended  Other Services None   Consulted and Agree with Plan of Care Patient        Problem List Patient Active Problem List   Diagnosis Date Noted  . Osteoporosis, unspecified 08/24/2013  . Low back pain 08/24/2013    Earlie Counts, PT 03/20/2015 5:16 PM   [facility] [address]  [address] [place], [place], [address] Phone: [phone]   Fax:  [phone]  Name: Connie Lozano MRN: [id number] Date of Birth: August 19, 1955

## 2015-03-24 ENCOUNTER — Ambulatory Visit: Payer: BLUE CROSS/BLUE SHIELD | Admitting: Physical Therapy

## 2015-03-24 ENCOUNTER — Encounter: Payer: Self-pay | Admitting: Physical Therapy

## 2015-03-24 DIAGNOSIS — M545 Low back pain, unspecified: Secondary | ICD-10-CM

## 2015-03-24 DIAGNOSIS — R531 Weakness: Secondary | ICD-10-CM

## 2015-03-24 NOTE — Therapy (Signed)
[facility] [address], [place], [place], [address] Phone: [phone]   Fax:  [phone]  Physical Therapy Treatment  Patient Details  Name: Niela Raible MRN: [id number] Date of Birth: October 25, 1955 Referring Provider: Dr. Delrae Rend  Encounter Date: 03/24/2015      PT End of Session - 03/24/15 1237    Visit Number 2   Date for PT Re-Evaluation 05/15/15   PT Start Time 1145   PT Stop Time 1230   PT Time Calculation (min) 45 min   Activity Tolerance Patient limited by pain   Behavior During Therapy [facility] for tasks assessed/performed      Past Medical History  Diagnosis Date  . Osteoporosis     Past Surgical History  Procedure Laterality Date  . Eye surgery      strabismus  . Tonsilectomy/adenoidectomy with myringotomy    . Hammer toe surgery    . Bunionectomy      There were no vitals filed for this visit.  Visit Diagnosis:  Bilateral low back pain without sciatica  Weakness      Subjective Assessment - 03/24/15 1153    Subjective Patient reports pain is inconsistent at times no pain, but can go up to 6/10 esspecially with prolonged sitting.    Pertinent History Bone Density shows L1 -4.1, femural neck -2.4.    Patient Stated Goals education of osteoporosis   Currently in Pain? No/denies   Multiple Pain Sites No                         [facility] Adult PT Treatment/Exercise - 03/24/15 0001    Bed Mobility   Bed Mobility --  Educated and answered questions in regard to sleep positions   Exercises   Exercises Lumbar   Lumbar Exercises: Aerobic   UBE (Upper Arm Bike) L 1 4 (2/2)  sitting on red physioball, incr time to 6 min    Lumbar Exercises: Standing   Other Standing Lumbar Exercises standing agains red pysioball 1# into 3 way raises 2 x 10  focus on pelvic floor contraction   Lumbar Exercises: Supine   Other Supine Lumbar Exercises Meeks method explanation and practice;  head-, shoulder- leg press, leg lenthening 5sec hold 5 reps each                PT Education - 03/24/15 1216    Education provided Yes   Education Details Meeks exercises: shoulder-, head- leg press, leg lenthener x 5 with 5 sec hold, Educated how to sit on the floor at school, sleeping etc.   Person(s) Educated Patient   Methods Explanation;Handout   Comprehension Verbalized understanding;Returned demonstration          PT Short Term Goals - 03/20/15 1708    PT SHORT TERM GOAL #1   Title understand how to manage osteoporosis to decrease pain and risk of fractures   Time 4   Period Weeks   Status New   PT SHORT TERM GOAL #2   Title independent with initial HEP   Time 4   Period Weeks   Status New   PT SHORT TERM GOAL #3   Title understand correct body mechanics with daily tasks to decrease fracture risk   Time 4   Period Weeks   Status New           PT Long Term Goals - 03/20/15 1710    PT LONG TERM GOAL #1  Title independent with HEP that includes working out at the gym   Time 8   Period Weeks   Status New   PT LONG TERM GOAL #2   Title lumbar pain with daily tasks decreased >/= 50% due to increased lumbar strength   Time 8   Period Weeks   Status New   PT LONG TERM GOAL #3   Title work with back pain decreased >/= 50% due to improved strength for hips and shoulders >/= 4+/5   Time 8   Period Weeks   Status New   PT LONG TERM GOAL #4   Title understand ways to exercise to increase bone mass without putting strain on lumbar spine   Time 8   Period Weeks   Status New               Plan - 03/24/15 1238    Clinical Impression Statement Patient is a 60 y.o. with diagnosis of osteoporosis.   Clinical Impairments Affecting Rehab Potential Pt with back pain since 10         Problem List Patient Active Problem List   Diagnosis Date Noted  . Osteoporosis, unspecified 08/24/2013  . Low back pain 08/24/2013    NAUMANN-HOUEGNIFIO,Spiros Greenfeld  PTA 03/24/2015, 1:35 PM  [facility] [address], [place], [place], [address] Phone: [phone]   Fax:  [phone]  Name: Nicha Zora MRN: [id number] Date of Birth: [birth year]

## 2015-03-24 NOTE — Patient Instructions (Addendum)
RE-ALIGNMENT ROUTINE EXERCISES-OSTEOPROROSIS BASIC FOR POSTURAL CORRECTION   RE-ALIGNMENT Tips BENEFITS: 1.It helps to re-align the curves of the back and improve standing posture. 2.It allows the back muscles to rest and strengthen in preparation for more activity. FREQUENCY: Daily, even after weeks, months and years of more advanced exercises. START: 1.All exercises start in the same position: lying on the back, arms resting on the supporting surface, palms up and slightly away from the body, backs of hands down, knees bent, feet flat. 2.The head, neck, arms, and legs are supported according to specific instructions of your therapist. Copyright  VHI. All rights reserved.    1. Decompression Exercise: Basic.   Takes compression off the vertebral bodies; increases tolerance for lying on the back; helps relieve back pain   Lie on back on firm surface, knees bent, feet flat, arms turned up, out to sides (~35 degrees). Head neck and arms supported as necessary. Time _5-15__ minutes. Surface: floor     2. Shoulder Press  Strengthens upper back extensors and scapular retractors.   Press both shoulders down. Hold _2-3__ seconds. Repeat _3-5__ times. Surface: floor        3. Head Press With Tobias  Strengthens neck extensors   Tuck chin SLIGHTLY toward chest, keep mouth closed. Feel weight on back of head. Increase weight by pressing head down. Hold _2-3__ seconds. Relax. Repeat 3-5___ times. Surface: floor   4. Leg Lengthener: stretches quadratus lumborum and hip flexors.  Strengthens quads and ankle dorsiflexors. Meeks Osteoporosis exercises  Laying on bed on back with knees bent.  1. Head Press: Press head into bed or one pillow with a neutral spine.  2. Shoulder Press:  Press shoulders down toward the bed. You are not squeezing shoulder blades together.   3. Leg lengthener: Pull your toes toward your head and push your heel toward the end of the bed like you're trying  to make yourself taller.  4. Leg Press: Press the entire leg down toward the bed. Imagine you are lying on the beach and you are making an impression of your leg.   5. Bilateral Leg Press: Same as #4, just with both legs at the same time.

## 2015-03-27 ENCOUNTER — Encounter: Payer: BLUE CROSS/BLUE SHIELD | Admitting: Physical Therapy

## 2015-04-03 ENCOUNTER — Ambulatory Visit: Payer: BLUE CROSS/BLUE SHIELD | Admitting: Physical Therapy

## 2015-04-03 DIAGNOSIS — M545 Low back pain, unspecified: Secondary | ICD-10-CM

## 2015-04-03 DIAGNOSIS — R531 Weakness: Secondary | ICD-10-CM

## 2015-04-03 NOTE — Patient Instructions (Addendum)
  Over Head Pull: Narrow Grip       On back, knees bent, feet flat, band across thighs, elbows straight but relaxed. Pull hands apart (start). Keeping elbows straight, bring arms up and over head, hands toward floor. Keep pull steady on band. Hold momentarily. Return slowly, keeping pull steady, back to start. Repeat _10__ times. Band color _yellow____   Side Pull: Double Arm   On back, knees bent, feet flat. Arms perpendicular to body, shoulder level, elbows straight but relaxed. Pull arms out to sides, elbows straight. Resistance band comes across collarbones, hands toward floor. Hold momentarily. Slowly return to starting position. Repeat __10_ times. Band color ___yellow__   Sash   On back, knees bent, feet flat, left hand on left hip, right hand above left. Pull right arm DIAGONALLY (hip to shoulder) across chest. Bring right arm along head toward floor. Hold momentarily. Slowly return to starting position. Repeat _10__ times. Do with left arm. Band color yellow______   Shoulder Rotation: Double Arm   On back, knees bent, feet flat, elbows tucked at sides, bent 90, hands palms up. Pull hands apart and down toward floor, keeping elbows near sides. Hold momentarily. Slowly return to starting position. Repeat _10__ times. Band color __yellow   Prone pelvic press  10x right and left each Prone pelvic press with knee flex  Right and left 10 x each and then bilaterally x 10 Prone pelvic press with hip extension right and left 10 x each Prone pelvic press with knee flex and hip ext Right and left 10 times each Upper body sequence  Start with pelvic press  Do 10 reps with arms in  T, W M Y shape keep neck in neutral position Lift head and arms up . Then lift upper body.  ____  [facility] [address], [place], [place] [address] Phone # [phone] Fax [phone]

## 2015-04-03 NOTE — Therapy (Signed)
[facility] [address], [place], [place], [address] Phone: [phone]   Fax:  [phone]  Physical Therapy Treatment  Patient Details  Name: Connie Lozano MRN: [id number] Date of Birth: 01/14/1956 Referring Provider: Dr. Delrae Rend  Encounter Date: 04/03/2015      PT End of Session - 04/03/15 1550    Visit Number 3   Date for PT Re-Evaluation 05/15/15   PT Start Time 1530   PT Stop Time 1615   PT Time Calculation (min) 45 min   Activity Tolerance Patient tolerated treatment well      Past Medical History  Diagnosis Date  . Osteoporosis     Past Surgical History  Procedure Laterality Date  . Eye surgery      strabismus  . Tonsilectomy/adenoidectomy with myringotomy    . Hammer toe surgery    . Bunionectomy      There were no vitals filed for this visit.  Visit Diagnosis:  Bilateral low back pain without sciatica  Weakness      Subjective Assessment - 04/03/15 1536    Currently in Pain? No/denies                         [facility] Adult PT Treatment/Exercise - 04/03/15 0001    Lumbar Exercises: Supine   Ab Set 5 reps   Bent Knee Raise 10 reps   Isometric Hip Flexion 10 reps   Other Supine Lumbar Exercises supine scap stab series with yellow and red band 10x each:  overhead, HABD, ER, sash   Lumbar Exercises: Prone   Other Prone Lumbar Exercises pelvic press with HS curls    Other Prone Lumbar Exercises pelvic press with hip extension; head lift with UE Is and Ts 10x each   Knee/Hip Exercises: Standing   Forward Step Up Right;Left;1 set;10 reps;Hand Hold: 0;Step Height: 8"   Other Standing Knee Exercises mirror feedback for squats and lunges 15x each                PT Education - 04/03/15 1548    Education provided Yes   Education Details ab series 1,2; supine scap stabilization; prone multifidi   Person(s) Educated Patient   Methods  Explanation;Handout;Demonstration   Comprehension Verbalized understanding;Returned demonstration          PT Short Term Goals - 04/03/15 1628    PT SHORT TERM GOAL #1   Title understand how to manage osteoporosis to decrease pain and risk of fractures   Status Achieved   PT SHORT TERM GOAL #2   Title independent with initial HEP   Time 4   Period Weeks   Status Partially Met   PT SHORT TERM GOAL #3   Title understand correct body mechanics with daily tasks to decrease fracture risk   Time 4   Period Weeks   Status Partially Met           PT Long Term Goals - 04/03/15 1629    PT LONG TERM GOAL #1   Title independent with HEP that includes working out at the gym   Time 8   Period Weeks   Status On-going   PT LONG TERM GOAL #2   Title lumbar pain with daily tasks decreased >/= 50% due to increased lumbar strength   Time 8   Period Weeks   Status On-going   PT LONG TERM GOAL #3   Title work with back pain decreased >/=  50% due to improved strength for hips and shoulders >/= 4+/5   Time 8   Period Weeks   Status On-going   PT LONG TERM GOAL #4   Title understand ways to exercise to increase bone mass without putting strain on lumbar spine   Time 8   Period Weeks   Status On-going               Plan - 04/03/15 1623    Clinical Impression Statement The patient brings in handouts she found on the internet with ex's, review and made recommendations for safe performance with avoidance of thoracic flexion and rotation.  She also reports doing 8 inch step ups, lunges and squats at home.  Patient needs moderate verbal cues with these to avoid hip/knee internal rotation and valgus.  Added new core strengthening ex's with therapist closely monitoring for pain and proper technique.  Also answered questions about positioning for reading in bed.   Patient reports mild LBP following treatment but declines the need for modalities.  She states the pain used to be across the low  back and that it is much better now.    PT Next Visit Plan recheck new HEP; recheck patellofemoral alignment with step ups, squats and lunges;  add plank on elbows or sideplank on elbows   PT Home Exercise Plan prone multifidi, abdominal brace series, supine red band scapular ex's        Problem List Patient Active Problem List   Diagnosis Date Noted  . Osteoporosis, unspecified 08/24/2013  . Low back pain 08/24/2013    [name] 04/03/2015, 4:30 PM  [facility] [address], [place], [place], [address] Phone: [phone]   Fax:  [phone]  Name: Connie Lozano MRN: [id number] Date of Birth: 02/26/1956    Ruben Im, PT 04/03/2015 4:30 PM Phone: [phone] Fax: [phone]

## 2015-04-10 ENCOUNTER — Ambulatory Visit: Payer: BLUE CROSS/BLUE SHIELD | Attending: Internal Medicine | Admitting: Physical Therapy

## 2015-04-10 DIAGNOSIS — M545 Low back pain, unspecified: Secondary | ICD-10-CM

## 2015-04-10 DIAGNOSIS — R531 Weakness: Secondary | ICD-10-CM | POA: Diagnosis present

## 2015-04-10 NOTE — Therapy (Signed)
[facility] [address] [place], [place], [address] Phone: [phone]   Fax:  [phone]  Physical Therapy Treatment  Patient Details  Name: Connie Lozano MRN: [id number] Date of Birth: 06-13-1955 Referring Provider: Dr. Delrae Rend  Encounter Date: 04/10/2015      PT End of Session - 04/10/15 1601    Visit Number 4   Date for PT Re-Evaluation 05/15/15   PT Start Time 1534   PT Stop Time 9093   PT Time Calculation (min) 40 min   Activity Tolerance Patient tolerated treatment well      Past Medical History  Diagnosis Date  . Osteoporosis     Past Surgical History  Procedure Laterality Date  . Eye surgery      strabismus  . Tonsilectomy/adenoidectomy with myringotomy    . Hammer toe surgery    . Bunionectomy      There were no vitals filed for this visit.  Visit Diagnosis:  Bilateral low back pain without sciatica  Weakness      Subjective Assessment - 04/10/15 1543    Subjective I didn't do as many of the exercises as I should have.  AM stiffness but no pain now.  Pain with sitting too long.  Life has been busy, usually about 10,000 steps.  Husband still doing vacuming, I lift things but hold them close to me.     Currently in Pain? No/denies   Pain Score 0-No pain   Aggravating Factors  sitting too long                         [facility] Adult PT Treatment/Exercise - 04/10/15 0001    Lumbar Exercises: Supine   Ab Set 5 reps   Bent Knee Raise 5 reps   Isometric Hip Flexion 5 reps   Other Supine Lumbar Exercises supine scap stab series with yellow and red band 10x each:  overhead, HABD, ER, sash  cues to avoid wrist extension   Lumbar Exercises: Prone   Other Prone Lumbar Exercises pelvic press with HS curls    Other Prone Lumbar Exercises pelvic press with hip extension; head lift with UE Is and Ts 10x each   Knee/Hip Exercises: Standing   Forward Step Up Right;Left;1 set;10  reps;Hand Hold: 0;Step Height: 8"   Functional Squat 10 reps   Other Standing Knee Exercises lunges 5x right/left                  PT Short Term Goals - 04/10/15 1700    PT SHORT TERM GOAL #1   Title understand how to manage osteoporosis to decrease pain and risk of fractures   Status Achieved   PT SHORT TERM GOAL #2   Title independent with initial HEP   Status Achieved   PT SHORT TERM GOAL #3   Title understand correct body mechanics with daily tasks to decrease fracture risk   Status Achieved           PT Long Term Goals - 04/10/15 1700    PT LONG TERM GOAL #1   Title independent with HEP that includes working out at the gym   Time 8   Period Weeks   Status On-going   PT LONG TERM GOAL #2   Title lumbar pain with daily tasks decreased >/= 50% due to increased lumbar strength   Time 8   Period Weeks   Status On-going   PT LONG  TERM GOAL #3   Title work with back pain decreased >/= 50% due to improved strength for hips and shoulders >/= 4+/5   Time 8   Period Weeks   Status On-going   PT LONG TERM GOAL #4   Title understand ways to exercise to increase bone mass without putting strain on lumbar spine   Time 8   Period Weeks   Status Partially Met               Plan - 04/10/15 1612    Clinical Impression Statement The patient needs technique correction of supine scapular ex's to avoid excessive wrist extension.  Cues needed as well with patellofemoral alignment with step downs and squats.  Improved activation of lumbar multifidi.  Discussed exercises that would not be appropriate with osteoporosis including toe touches, sit ups, rotation.  Modified planks to knees in prone and sidelying secondary to min complaint of back pain.   Should meet remaining goals in 2-3 visits.    PT Next Visit Plan patient education on osteoporosis;    review core stabilization/HEP ;  discuss gym equipment she may be using and technique (best and worst gym machines)         Problem List Patient Active Problem List   Diagnosis Date Noted  . Osteoporosis, unspecified 08/24/2013  . Low back pain 08/24/2013    [name] 04/10/2015, 5:07 PM  [facility] [address] [place], [place], [address] Phone: [phone]   Fax:  [phone]  Name: Theola Cuellar MRN: [id number] Date of Birth: 1955/06/16    Ruben Im, PT 04/10/2015 5:07 PM Phone: [phone] Fax: [phone]

## 2015-04-17 ENCOUNTER — Encounter: Payer: Self-pay | Admitting: Physical Therapy

## 2015-04-17 ENCOUNTER — Ambulatory Visit: Payer: BLUE CROSS/BLUE SHIELD | Admitting: Physical Therapy

## 2015-04-17 DIAGNOSIS — R531 Weakness: Secondary | ICD-10-CM

## 2015-04-17 DIAGNOSIS — M545 Low back pain, unspecified: Secondary | ICD-10-CM

## 2015-04-17 NOTE — Therapy (Signed)
[facility] [address] [place], [place], [address] Phone: [phone]   Fax:  [phone]  Physical Therapy Treatment  Patient Details  Name: Connie Lozano MRN: [id number] Date of Birth: 1955/08/08 Referring Provider: Dr. Delrae Rend  Encounter Date: 04/17/2015      PT End of Session - 04/17/15 1535    Visit Number 5   Date for PT Re-Evaluation 05/15/15   PT Start Time 1533   PT Stop Time 1611   PT Time Calculation (min) 38 min   Activity Tolerance Patient tolerated treatment well   Behavior During Therapy [facility] for tasks assessed/performed      Past Medical History  Diagnosis Date  . Osteoporosis     Past Surgical History  Procedure Laterality Date  . Eye surgery      strabismus  . Tonsilectomy/adenoidectomy with myringotomy    . Hammer toe surgery    . Bunionectomy      There were no vitals filed for this visit.  Visit Diagnosis:  Bilateral low back pain without sciatica  Weakness      Subjective Assessment - 04/17/15 1537    Subjective I feel improvement.  I have been sleeping with a pillow under my knees.  When I sleep on my side that hurts.    Pertinent History Bone Density shows L1 -4.1, femural neck -2.4.    Patient Stated Goals education of osteoporosis   Currently in Pain? No/denies                         [facility] Adult PT Treatment/Exercise - 04/17/15 0001    Self-Care   Self-Care Other Self-Care Comments  sleep with pillows to support back;    Other Self-Care Comments  exercises to not do in the gym to strain the back   Lumbar Exercises: Aerobic   UBE (Upper Arm Bike) L 1 4 (2/2)   Lumbar Exercises: Supine   Ab Set 5 reps   Bent Knee Raise 5 reps   Isometric Hip Flexion 5 reps   Shoulder Exercises: Supine   Other Supine Exercises supine on ball chest press 1# bil. 20x   Shoulder Exercises: Prone   Flexion Right;Left;Strengthening;10 reps;Weights   Flexion  Weight (lbs) 1   Horizontal ABduction 1 Both;10 reps;Weights   Horizontal ABduction 1 Weight (lbs) 1   Shoulder Exercises: Standing   ABduction Left;Right;10 reps;Weights   Shoulder ABduction Weight (lbs) 1   Other Standing Exercises 3 way shoulder elevation 1# each 20x each   Other Standing Exercises overhead press with 1# 10x                PT Education - 04/17/15 1613    Education provided Yes   Education Details exercising at the gym for upper body with free weights; sleeping with correct posture and using pillows.    Person(s) Educated Patient   Methods Explanation;Demonstration;Verbal cues;Handout   Comprehension Returned demonstration;Verbalized understanding          PT Short Term Goals - 04/10/15 1700    PT SHORT TERM GOAL #1   Title understand how to manage osteoporosis to decrease pain and risk of fractures   Status Achieved   PT SHORT TERM GOAL #2   Title independent with initial HEP   Status Achieved   PT SHORT TERM GOAL #3   Title understand correct body mechanics with daily tasks to decrease fracture risk   Status Achieved  PT Long Term Goals - 04/17/15 1548    PT LONG TERM GOAL #1   Title independent with HEP that includes working out at the gym   Time 8   Period Weeks   Status On-going  still learning   PT LONG TERM GOAL #2   Title lumbar pain with daily tasks decreased >/= 50% due to increased lumbar strength   Time 8   Period Weeks   Status Achieved   PT LONG TERM GOAL #3   Title work with back pain decreased >/= 50% due to improved strength for hips and shoulders >/= 4+/5   Time 8   Period Weeks   Status On-going   PT LONG TERM GOAL #4   Title understand ways to exercise to increase bone mass without putting strain on lumbar spine   Time 8   Period Weeks   Status Partially Met               Plan - 04/17/15 1616    Clinical Impression Statement Patient is learning correct exercises to do to not strain her back.   Patient was educated on exercises to not so when having osteoporosis.  Patient would benefit from skiled therapy to  educate correct exericses with good posture.    Pt will benefit from skilled therapeutic intervention in order to improve on the following deficits Pain;Decreased strength;Decreased endurance   Rehab Potential Excellent   Clinical Impairments Affecting Rehab Potential Pt with back pain since 10    PT Frequency 1x / week   PT Duration 8 weeks   PT Treatment/Interventions Therapeutic activities;Therapeutic exercise;Moist Heat;Electrical Stimulation;Cryotherapy;Ultrasound;Manual techniques;Patient/family education;Neuromuscular re-education   PT Next Visit Plan education on leg exercises at gym   PT Home Exercise Plan gym exercises   Consulted and Agree with Plan of Care Patient        Problem List Patient Active Problem List   Diagnosis Date Noted  . Osteoporosis, unspecified 08/24/2013  . Low back pain 08/24/2013    Earlie Counts, PT 04/17/2015 4:20 PM   [facility] [address] [place], [place], [address] Phone: [phone]   Fax:  [phone]  Name: Connie Lozano MRN: [id number] Date of Birth: Jul 24, 1955

## 2015-04-17 NOTE — Patient Instructions (Signed)
Arm bike 2 min forward/2 min backward level 1  During exercises with weights contract abdominals, breath out with the hardest part of the exercise. Keep feet hip width apart.

## 2015-04-24 ENCOUNTER — Ambulatory Visit: Payer: BLUE CROSS/BLUE SHIELD | Admitting: Physical Therapy

## 2015-04-24 DIAGNOSIS — R531 Weakness: Secondary | ICD-10-CM

## 2015-04-24 DIAGNOSIS — M545 Low back pain, unspecified: Secondary | ICD-10-CM

## 2015-04-24 NOTE — Patient Instructions (Addendum)
ABDUCTION: Standing - Resistance Band (Active)   Stand, feet flat. Against yellow resistance band, lift right leg out to side. Complete _1-2__ sets of __10_ repetitions. Perform _1__ sessions per day.  ADDUCTION: Standing - Stable: Resistance Band (Active)   Stand, right leg out to side as far as possible. Against yellow resistance band, draw leg in across midline. Complete _1-2__ sets of 10___ repetitions. Perform _1__ sessions per day.  Strengthening: Hip Flexion - Resisted   With tubing around left ankle, anchor behind, bring leg forward, keeping knee straight. Repeat ___10_ times per set. Do __1__ sets per session. Do __1__ sessions per day.  Strengthening: Hip Extension - Resisted   With tubing around right ankle, face anchor and pull leg straight back. Repeat _10___ times per set. Do _1-2___ sets per session. Do ____1 sessions per day.   Best Gym machines:  Lat pull down bar 20# 2-3x10  Seated or standing Seated rows 20# Leg press Hip Machine  Geophysical data processor press exercises not optimal)  [facility] [address] [place], [place] [address] Phone # [phone] Fax [phone]

## 2015-04-24 NOTE — Therapy (Signed)
[facility] [address] [place], [place], [address] Phone: [phone]   Fax:  [phone]  Physical Therapy Treatment  Patient Details  Name: Connie Lozano MRN: [id number] Date of Birth: [birth year] Referring Provider: Dr. Delrae Rend  Encounter Date: 04/24/2015      PT End of Session - 04/24/15 1540    Visit Number 6   Date for PT Re-Evaluation 05/15/15   PT Start Time 1530   PT Stop Time 1615   PT Time Calculation (min) 45 min   Activity Tolerance Patient tolerated treatment well      Past Medical History  Diagnosis Date  . Osteoporosis     Past Surgical History  Procedure Laterality Date  . Eye surgery      strabismus  . Tonsilectomy/adenoidectomy with myringotomy    . Hammer toe surgery    . Bunionectomy      There were no vitals filed for this visit.  Visit Diagnosis:  Bilateral low back pain without sciatica  Weakness      Subjective Assessment - 04/24/15 1534    Subjective If I sit too long my back hurts.  I did vacumn this past weekend using good body mechanics.     Pain Score 0-No pain   Pain Location Back   Pain Orientation Mid   Pain Frequency Intermittent                         [facility] Adult PT Treatment/Exercise - 04/24/15 0001    Knee/Hip Exercises: Standing   SLS with Vectors red band 10x R/L   Shoulder Exercises: ROM/Strengthening   Cybex Row 15 reps   Cybex Row Limitations 20#   Shoulder Exercises: Power Warden/ranger Exercises 20# lat bar standing and sitting 3x10     Comprehensive review of other gym equipment and discussion on weight bearing ex and resistive ex for building bone density.            PT Education - 04/24/15 1558    Education provided Yes   Education Details review of best gym equipment;  hip standing red band   Person(s) Educated Patient   Methods Explanation;Demonstration;Handout   Comprehension Verbalized  understanding;Returned demonstration          PT Short Term Goals - 04/24/15 1537    PT SHORT TERM GOAL #1   Title understand how to manage osteoporosis to decrease pain and risk of fractures   Status Achieved   PT SHORT TERM GOAL #2   Title independent with initial HEP   Status Achieved   PT SHORT TERM GOAL #3   Title understand correct body mechanics with daily tasks to decrease fracture risk   Status Achieved           PT Long Term Goals - 04/24/15 1537    PT LONG TERM GOAL #1   Title independent with HEP that includes working out at the gym   PT [place] #2   Title lumbar pain with daily tasks decreased >/= 50% due to increased lumbar strength   Baseline 75%   Status Achieved   PT LONG TERM GOAL #3   Title work with back pain decreased >/= 50% due to improved strength for hips and shoulders >/= 4+/5   Baseline 75%   Status Achieved   PT LONG TERM GOAL #4   Title understand ways to exercise to increase bone mass without  putting strain on lumbar spine   Time 8   Period Weeks   Status Partially Met               Plan - 04/24/15 1559    Clinical Impression Statement The patient is progressing well with short term and long term goals.   States overall she is 75% better since starting PT.   She reports pain primarily in the mornings and after sitting more than 1 hour.  She continues to have numerous questions regarding whether certain exercises are safe for her to do.  Discussed appropriate gym machines as well as a progression of hip strengthening with her red band for home.   She would like to try working on this HEP on her own for the next 2-3 weeks and then follow up for 1 additional visit for remaining questions.  She will call prior to that if any issues arise.     PT Next Visit Plan check progress toward remaining goals and review final HEP and gym program;  probable discharge next visit;  do FOTO        Problem List Patient Active Problem List    Diagnosis Date Noted  . Osteoporosis, unspecified 08/24/2013  . Low back pain 08/24/2013    Connie Lozano 04/24/2015, 5:04 PM   [facility] [address] [place], [place], [address] Phone: [phone]   Fax:  [phone]  Name: Connie Lozano MRN: [id number] Date of Birth: 1955-06-04  Ruben Im, PT 04/24/2015 5:07 PM Phone: [phone] Fax: [phone]

## 2015-05-15 ENCOUNTER — Ambulatory Visit: Payer: BLUE CROSS/BLUE SHIELD | Attending: Internal Medicine | Admitting: Physical Therapy

## 2015-05-15 DIAGNOSIS — M545 Low back pain, unspecified: Secondary | ICD-10-CM

## 2015-05-15 DIAGNOSIS — R531 Weakness: Secondary | ICD-10-CM | POA: Diagnosis present

## 2015-05-15 NOTE — Therapy (Signed)
[facility] [address], [place], [place], [address] Phone: [phone]   Fax:  [phone]  Physical Therapy Treatment/Discharge Summary  Patient Details  Name: Connie Lozano MRN: [id number] Date of Birth: 1955/08/05 Referring Provider: Dr. Delrae Rend  Encounter Date: 05/15/2015      PT End of Session - 05/15/15 1619    Visit Number 7   Date for PT Re-Evaluation 05/15/15   PT Start Time 1530   PT Stop Time 1615   PT Time Calculation (min) 45 min   Activity Tolerance Patient tolerated treatment well      Past Medical History  Diagnosis Date  . Osteoporosis     Past Surgical History  Procedure Laterality Date  . Eye surgery      strabismus  . Tonsilectomy/adenoidectomy with myringotomy    . Hammer toe surgery    . Bunionectomy      There were no vitals filed for this visit.  Visit Diagnosis:  Bilateral low back pain without sciatica  Weakness      Subjective Assessment - 05/15/15 1531    Subjective My job responsibilities changed and became more physical the last 1 1/2 weeks so I gave them my 2 week notice.  I've been going to the gym.  Avoiding heavy lifting.     Currently in Pain? No/denies   Pain Score 0-No pain   Pain Location Back   Pain Orientation Mid   Pain Type Chronic pain   Pain Onset More than a month ago   Pain Frequency Intermittent   Aggravating Factors  rising after sitting too long            [facility] PT Assessment - 05/15/15 0001    Observation/Other Assessments   Focus on Therapeutic Outcomes (FOTO)  32% limitation   Posture/Postural Control   Posture Comments 60 1/4   AROM   Lumbar Extension 30   Lumbar - Right Side Bend 35   Lumbar - Left Side Bend 40   Strength   Overall Strength Comments 4+/5 to 5-/5 hips, abdominals, extensors                     [facility] Adult PT Treatment/Exercise - 05/15/15 0001    Lumbar Exercises: Aerobic   Elliptical 2 min  with incline and without   Lumbar Exercises: Quadruped   Opposite Arm/Leg Raise Right arm/Left leg;Left arm/Right leg;10 reps      Comprehensive discussion of progression and review of HEP and gym program          PT Education - 05/15/15 1618    Education provided Yes   Education Details review of HEP and safe self progression;  discussed yoga, pilates and implications for osteo; gym program and progression   Person(s) Educated Patient   Methods Explanation   Comprehension Verbalized understanding          PT Short Term Goals - 05/15/15 1606    PT SHORT TERM GOAL #1   Title understand how to manage osteoporosis to decrease pain and risk of fractures   Status Achieved   PT SHORT TERM GOAL #2   Title independent with initial HEP   Status Achieved   PT SHORT TERM GOAL #3   Title understand correct body mechanics with daily tasks to decrease fracture risk   Status Achieved           PT Long Term Goals - 05/15/15 1606    PT LONG TERM  GOAL #1   Title independent with HEP that includes working out at the gym   Status Achieved   PT LONG TERM GOAL #2   Title lumbar pain with daily tasks decreased >/= 50% due to increased lumbar strength   Status Achieved   PT LONG TERM GOAL #3   Title work with back pain decreased >/= 50% due to improved strength for hips and shoulders >/= 4+/5   Status Achieved   PT LONG TERM GOAL #4   Title understand ways to exercise to increase bone mass without putting strain on lumbar spine   Status Achieved               Plan - 05/15/15 1622    Clinical Impression Statement The patient expresses a good understanding of safe movement/osteoporosis precautions for exercise.  She has met all STGs and LTGs.  Overall she reported on last visit she was 75% better.  She has gained 1/4 inch in height since initial eval.  Her FOTO functional outcome score improved slightly to 32% limited.  Treatment focus on core strengthening, hip strengthening   and extensive patient education. Recommend discharge from PT to independent ex program at this time.         PHYSICAL THERAPY DISCHARGE SUMMARY  Visits from Start of Care: 7  Current functional level related to goals / functional outcomes: See clinical impressions above   Remaining deficits: All goals met.   Education / Equipment: Comprehensive HEP Plan: Patient agrees to discharge.  Patient goals were met. Patient is being discharged due to meeting the stated rehab goals.  ?????        Problem List Patient Active Problem List   Diagnosis Date Noted  . Osteoporosis, unspecified 08/24/2013  . Low back pain 08/24/2013   Ruben Im, PT 05/15/2015 4:27 PM Phone: [phone] Fax: [phone] [name] 05/15/2015, 4:25 PM  [facility] [address] [place], [place], [address] Phone: [phone]   Fax:  [phone]  Name: Natally Ribera MRN: [id number] Date of Birth: 05/11/1955  Ruben Im, PT 05/15/2015 4:26 PM Phone: [phone] Fax: [phone]

## 2015-09-10 ENCOUNTER — Other Ambulatory Visit: Payer: Self-pay | Admitting: Family Medicine

## 2015-09-10 DIAGNOSIS — Z1231 Encounter for screening mammogram for malignant neoplasm of breast: Secondary | ICD-10-CM

## 2015-10-02 ENCOUNTER — Ambulatory Visit
Admission: RE | Admit: 2015-10-02 | Discharge: 2015-10-02 | Disposition: A | Discharge status: Home / Self Care | Payer: BLUE CROSS/BLUE SHIELD | Source: Ambulatory Visit | Attending: Family Medicine | Admitting: Family Medicine

## 2015-10-02 DIAGNOSIS — Z1231 Encounter for screening mammogram for malignant neoplasm of breast: Secondary | ICD-10-CM

## 2016-08-23 ENCOUNTER — Other Ambulatory Visit: Payer: Self-pay | Admitting: Family Medicine

## 2016-08-23 DIAGNOSIS — Z1231 Encounter for screening mammogram for malignant neoplasm of breast: Secondary | ICD-10-CM

## 2016-09-13 ENCOUNTER — Other Ambulatory Visit (HOSPITAL_COMMUNITY)
Admission: RE | Admit: 2016-09-13 | Discharge: 2016-09-13 | Disposition: A | Discharge status: Home / Self Care | Payer: BLUE CROSS/BLUE SHIELD | Source: Ambulatory Visit | Attending: Family Medicine | Admitting: Family Medicine

## 2016-09-13 ENCOUNTER — Other Ambulatory Visit: Payer: Self-pay | Admitting: Family Medicine

## 2016-09-13 DIAGNOSIS — Z01411 Encounter for gynecological examination (general) (routine) with abnormal findings: Secondary | ICD-10-CM | POA: Diagnosis present

## 2016-09-15 LAB — CYTOLOGY - PAP
DIAGNOSIS: NEGATIVE
HPV: NOT DETECTED

## 2016-10-04 ENCOUNTER — Ambulatory Visit
Admission: RE | Admit: 2016-10-04 | Discharge: 2016-10-04 | Disposition: A | Discharge status: Home / Self Care | Payer: BLUE CROSS/BLUE SHIELD | Source: Ambulatory Visit | Attending: Family Medicine | Admitting: Family Medicine

## 2016-10-04 DIAGNOSIS — Z1231 Encounter for screening mammogram for malignant neoplasm of breast: Secondary | ICD-10-CM

## 2017-09-16 ENCOUNTER — Other Ambulatory Visit: Payer: Self-pay | Admitting: Family Medicine

## 2017-09-16 DIAGNOSIS — Z1231 Encounter for screening mammogram for malignant neoplasm of breast: Secondary | ICD-10-CM

## 2017-10-06 ENCOUNTER — Ambulatory Visit
Admission: RE | Admit: 2017-10-06 | Discharge: 2017-10-06 | Disposition: A | Discharge status: Home / Self Care | Payer: Self-pay | Source: Ambulatory Visit | Attending: Family Medicine | Admitting: Family Medicine

## 2017-10-06 DIAGNOSIS — Z1231 Encounter for screening mammogram for malignant neoplasm of breast: Secondary | ICD-10-CM

## 2018-08-29 ENCOUNTER — Other Ambulatory Visit: Payer: Self-pay | Admitting: Family Medicine

## 2018-08-29 DIAGNOSIS — Z1231 Encounter for screening mammogram for malignant neoplasm of breast: Secondary | ICD-10-CM

## 2018-09-19 ENCOUNTER — Other Ambulatory Visit: Payer: Self-pay | Admitting: Family Medicine

## 2018-09-19 DIAGNOSIS — M810 Age-related osteoporosis without current pathological fracture: Secondary | ICD-10-CM

## 2018-10-13 ENCOUNTER — Other Ambulatory Visit: Payer: Self-pay

## 2018-10-13 ENCOUNTER — Ambulatory Visit
Admission: RE | Admit: 2018-10-13 | Discharge: 2018-10-13 | Disposition: A | Discharge status: Home / Self Care | Payer: BLUE CROSS/BLUE SHIELD | Source: Ambulatory Visit | Attending: Family Medicine | Admitting: Family Medicine

## 2018-10-13 DIAGNOSIS — Z1231 Encounter for screening mammogram for malignant neoplasm of breast: Secondary | ICD-10-CM

## 2018-11-24 ENCOUNTER — Other Ambulatory Visit: Payer: Self-pay

## 2018-11-24 ENCOUNTER — Ambulatory Visit
Admission: RE | Admit: 2018-11-24 | Discharge: 2018-11-24 | Disposition: A | Discharge status: Home / Self Care | Payer: Medicare HMO | Source: Ambulatory Visit | Attending: Family Medicine | Admitting: Family Medicine

## 2018-11-24 DIAGNOSIS — M810 Age-related osteoporosis without current pathological fracture: Secondary | ICD-10-CM

## 2019-04-05 ENCOUNTER — Ambulatory Visit: Payer: Medicare HMO

## 2019-04-10 ENCOUNTER — Ambulatory Visit: Payer: Medicare HMO

## 2019-04-13 ENCOUNTER — Ambulatory Visit: Payer: Medicare HMO | Attending: Internal Medicine

## 2019-04-13 DIAGNOSIS — Z23 Encounter for immunization: Secondary | ICD-10-CM | POA: Insufficient documentation

## 2019-04-13 NOTE — Progress Notes (Signed)
   Covid-19 Vaccination Clinic  Name:  Connie Lozano    MRN: [id number] DOB: Aug 04, 1955  04/13/2019  Connie Lozano was observed post Covid-19 immunization for 15 minutes without incidence. She was provided with Vaccine Information Sheet and instruction to access the V-Safe system.   Connie Lozano was instructed to call 911 with any severe reactions post vaccine: Marland Kitchen Difficulty breathing  . Swelling of your face and throat  . A fast heartbeat  . A bad rash all over your body  . Dizziness and weakness    Immunizations Administered    Name Date Dose VIS Date Route   Pfizer COVID-19 Vaccine 04/13/2019  5:09 PM 0.3 mL 02/16/2019 Intramuscular   Manufacturer: [place]   Lot: [id number]   [place]: [id number]

## 2019-05-08 ENCOUNTER — Ambulatory Visit: Payer: Medicare HMO | Attending: Internal Medicine

## 2019-05-08 DIAGNOSIS — Z23 Encounter for immunization: Secondary | ICD-10-CM | POA: Insufficient documentation

## 2019-05-08 NOTE — Progress Notes (Signed)
   Covid-19 Vaccination Clinic  Name:  Connie Lozano    MRN: [id number] DOB: 02-Sep-1955  05/08/2019  [name] was observed post Covid-19 immunization for 15 minutes without incident. She was provided with Vaccine Information Sheet and instruction to access the V-Safe system.   [name] was instructed to call 911 with any severe reactions post vaccine: Marland Kitchen Difficulty breathing  . Swelling of face and throat  . A fast heartbeat  . A bad rash all over body  . Dizziness and weakness   Immunizations Administered    Name Date Dose VIS Date Route   Pfizer COVID-19 Vaccine 05/08/2019  2:54 PM 0.3 mL 02/16/2019 Intramuscular   Manufacturer: [place]   Lot: [id number]   [place]: [id number]

## 2019-07-09 ENCOUNTER — Encounter: Payer: Self-pay | Admitting: Gastroenterology

## 2019-08-08 ENCOUNTER — Ambulatory Visit (AMBULATORY_SURGERY_CENTER): Payer: Self-pay | Admitting: *Deleted

## 2019-08-08 ENCOUNTER — Other Ambulatory Visit: Payer: Self-pay

## 2019-08-08 VITALS — Ht 60.0 in | Wt 94.0 lb

## 2019-08-08 DIAGNOSIS — Z1211 Encounter for screening for malignant neoplasm of colon: Secondary | ICD-10-CM

## 2019-08-08 NOTE — Progress Notes (Signed)
No egg or soy allergy known to patient  No issues with past sedation with any surgeries  or procedures, no intubation problems  No diet pills per patient No home 02 use per patient  No blood thinners per patient  Pt denies issues with constipation  No A fib or A flutter  EMMI video sent to pt's e mail   05-08-19 comp covid vaccines   Due to the COVID-19 pandemic we are asking patients to follow these guidelines. Please only bring one care partner. Please be aware that your care partner may wait in the car in the parking lot or if they feel like they will be too hot to wait in the car, they may wait in the lobby on the 4th floor. All care partners are required to wear a mask the entire time (we do not have any that we can provide them), they need to practice social distancing, and we will do a Covid check for all patient's and care partners when you arrive. Also we will check their temperature and your temperature. If the care partner waits in their car they need to stay in the parking lot the entire time and we will call them on their cell phone when the patient is ready for discharge so they can bring the car to the front of the building. Also all patient's will need to wear a mask into building.

## 2019-08-22 ENCOUNTER — Encounter: Payer: Self-pay | Admitting: Certified Registered Nurse Anesthetist

## 2019-08-23 ENCOUNTER — Other Ambulatory Visit: Payer: Self-pay

## 2019-08-23 ENCOUNTER — Encounter: Payer: Self-pay | Admitting: Gastroenterology

## 2019-08-23 ENCOUNTER — Ambulatory Visit (AMBULATORY_SURGERY_CENTER): Payer: Medicare HMO | Admitting: Gastroenterology

## 2019-08-23 VITALS — BP 133/83 | HR 73 | Temp 97.5°F | Resp 10 | Ht 62.0 in | Wt 94.0 lb

## 2019-08-23 DIAGNOSIS — D125 Benign neoplasm of sigmoid colon: Secondary | ICD-10-CM

## 2019-08-23 DIAGNOSIS — Z1211 Encounter for screening for malignant neoplasm of colon: Secondary | ICD-10-CM

## 2019-08-23 DIAGNOSIS — D124 Benign neoplasm of descending colon: Secondary | ICD-10-CM

## 2019-08-23 DIAGNOSIS — Z86010 Personal history of colonic polyps: Secondary | ICD-10-CM | POA: Diagnosis not present

## 2019-08-23 MED ORDER — SODIUM CHLORIDE 0.9 % IV SOLN: 500.0000 mL | INTRAVENOUS | Status: DC

## 2019-08-23 NOTE — Op Note (Addendum)
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 The patient's tolerance of                            previous anesthesia was also reviewed. The risks                            and benefits of the procedure and the sedation                            options and risks were discussed with the patient.                            All questions were answered, and informed consent                            was obtained. Prior Anticoagulants: The patient has                            taken no previous anticoagulant or antiplatelet                            agents. ASA Grade Assessment: II - A patient with  mild systemic disease. After reviewing the risks                            and benefits, the patient was deemed in                            satisfactory condition to undergo the procedure.                           After obtaining informed consent, the colonoscope                            was passed under direct vision. Throughout the                            procedure, the patient's blood pressure, pulse, and                            oxygen  saturations were monitored continuously. The                            Colonoscope was introduced through the anus and                            advanced to the 3 cm into the ileum. A second                            forward view of the right colon was performed. The                            colonoscopy was technically difficult and complex                            due to a redundant colon, significant looping and a                            tortuous colon. Successful completion of the                            procedure was aided by applying abdominal pressure.                            The patient tolerated the procedure well. The                            quality of the bowel preparation was good. The                            terminal ileum, ileocecal valve, appendiceal                            orifice, and rectum were photographed. Scope In: 11:18:57 AM Scope Out: 11:39:09 AM Scope Withdrawal Time: 0 hours 14 minutes 29  seconds  Total Procedure Duration: 0 hours 20 minutes 12 seconds  Findings:                 The perianal and digital rectal examinations were                            normal.                           A 8 mm polyp was found in the distal sigmoid colon                            located 28cm from the anal verge. The polyp was                            pedunculated. The polyp was removed with a cold                            snare. Resection and retrieval were complete.                            Estimated blood loss was minimal.                           A 3 mm polyp was found in the descending colon. The                            polyp was flat. The polyp was removed with a cold                            snare. Resection and retrieval were complete.                            Estimated blood loss was minimal.                           Non-bleeding internal hemorrhoids were found. Complications:            No immediate complications. Estimated blood  loss:                            Minimal. Estimated Blood Loss:     Estimated blood loss was minimal. Impression:               - One 8 mm polyp in the distal sigmoid colon,                            removed with a cold snare. Resected and retrieved.                           - One 3 mm polyp in the descending colon, removed                            with a cold snare. Resected and retrieved.                           -  Non-bleeding internal hemorrhoids. Recommendation:           - Patient has a contact number available for                            emergencies. The signs and symptoms of potential                            delayed complications were discussed with the                            patient. Return to normal activities tomorrow.                            Written discharge instructions were provided to the                            patient.                           - Resume previous diet.                           - Continue present medications.                           - Await pathology results.                           - Repeat colonoscopy date to be determined after                            pending pathology results are reviewed for                            surveillance.                           - Emerging evidence supports eating a diet of                            fruits, vegetables, grains, calcium, and yogurt                            while reducing red meat and alcohol may reduce the                            risk of colon cancer.                           - Thank you for allowing me to be involved in your                            colon cancer prevention. Thornton Park MD, MD 08/23/2019 11:47:57 AM This report has been signed electronically. Addendum Number: 1   Addendum Date: 08/28/2019 8:15:15 AM      The patient  had a normal colonoscopy in the past. Not "no" colonoscopy. Thornton Park MD, MD 08/28/2019 8:15:43 AM This report has been signed  electronically.

## 2019-08-23 NOTE — Progress Notes (Signed)
Report given to PACU, vss 

## 2019-08-23 NOTE — Patient Instructions (Signed)
Handout on polyps given. ° °YOU HAD AN ENDOSCOPIC PROCEDURE TODAY AT THE [facility]:   Refer to the procedure report that was given to you for any specific questions about what was found during the examination.  If the procedure report does not answer your questions, please call your gastroenterologist to clarify.  If you requested that your care partner not be given the details of your procedure findings, then the procedure report has been included in a sealed envelope for you to review at your convenience later. ° °YOU SHOULD EXPECT: Some feelings of bloating in the abdomen. Passage of more gas than usual.  Walking can help get rid of the air that was put into your GI tract during the procedure and reduce the bloating. If you had a lower endoscopy (such as a colonoscopy or flexible sigmoidoscopy) you may notice spotting of blood in your stool or on the toilet paper. If you underwent a bowel prep for your procedure, you may not have a normal bowel movement for a few days. ° °Please Note:  You might notice some irritation and congestion in your nose or some drainage.  This is from the oxygen used during your procedure.  There is no need for concern and it should clear up in a day or so. ° °SYMPTOMS TO REPORT IMMEDIATELY: ° °Following lower endoscopy (colonoscopy or flexible sigmoidoscopy): ° Excessive amounts of blood in the stool ° Significant tenderness or worsening of abdominal pains ° Swelling of the abdomen that is new, acute ° Fever of 100°F or higher ° °For urgent or emergent issues, a gastroenterologist can be reached at any hour by calling [phone]. °Do not use MyChart messaging for urgent concerns.  ° ° °DIET:  We do recommend a small meal at first, but then you may proceed to your regular diet.  Drink plenty of fluids but you should avoid alcoholic beverages for 24 hours. ° °ACTIVITY:  You should plan to take it easy for the rest of today and you should NOT DRIVE or use heavy machinery  until tomorrow (because of the sedation medicines used during the test).   ° °FOLLOW UP: °Our staff will call the number listed on your records 48-72 hours following your procedure to check on you and address any questions or concerns that you may have regarding the information given to you following your procedure. If we do not reach you, we will leave a message.  We will attempt to reach you two times.  During this call, we will ask if you have developed any symptoms of COVID 19. If you develop any symptoms (ie: fever, flu-like symptoms, shortness of breath, cough etc.) before then, please call [phone].  If you test positive for Covid 19 in the 2 weeks post procedure, please call and report this information to us.   ° °If any biopsies were taken you will be contacted by phone or by letter within the next 1-3 weeks.  Please call us at [phone] if you have not heard about the biopsies in 3 weeks.  ° ° °SIGNATURES/CONFIDENTIALITY: °You and/or your care partner have signed paperwork which will be entered into your electronic medical record.  These signatures attest to the fact that that the information above on your After Visit Summary has been reviewed and is understood.  Full responsibility of the confidentiality of this discharge information lies with you and/or your care-partner.  °

## 2019-08-23 NOTE — Progress Notes (Signed)
Called to room to assist during endoscopic procedure.  Patient ID and intended procedure confirmed with present staff. Received instructions for my participation in the procedure from the performing physician.  

## 2019-08-23 NOTE — Progress Notes (Signed)
Vs cw  

## 2019-08-27 ENCOUNTER — Telehealth: Payer: Self-pay | Admitting: *Deleted

## 2019-08-27 NOTE — Telephone Encounter (Signed)
°  Follow up Call-  Call back number 08/23/2019  Post procedure Call Back phone  # [phone]  Permission to leave phone message Yes  Some recent data might be hidden     Patient questions:  Do you have a fever, pain , or abdominal swelling? No. Pain Score  0 *  Have you tolerated food without any problems? Yes.    Have you been able to return to your normal activities? Yes.    Do you have any questions about your discharge instructions: Diet   No. Medications  No. Follow up visit  No.  Do you have questions or concerns about your Care? No. -pt does report a sore throat but states she feels it is "related to allergies and post nasal drip." Pt instructed to call if she has any other symptoms develop or with any other concerns.   Actions: * If pain score is 4 or above: No action needed, pain <4.  1. Have you developed a fever since your procedure? no  2.   Have you had an respiratory symptoms (SOB or cough) since your procedure? no  3.   Have you tested positive for COVID 19 since your procedure no  4.   Have you had any family members/close contacts diagnosed with the COVID 19 since your procedure?  no   If yes to any of these questions please route to Joylene John, RN and Erenest Rasher, RN

## 2019-08-28 ENCOUNTER — Encounter: Payer: Self-pay | Admitting: Gastroenterology

## 2019-09-06 ENCOUNTER — Telehealth: Payer: Self-pay | Admitting: Gastroenterology

## 2019-09-06 NOTE — Telephone Encounter (Signed)
Dr. Tarri Glenn,  I spoke with patient earlier as she called for colonoscopy results, I read the letter that you sent out to her on 08/28/19. Patient calling back wanting to speak with you. Thank you

## 2019-09-06 NOTE — Telephone Encounter (Signed)
As I am out of the office this week, please find out more information so that we can work to get her questions answered quickly. Thank you.

## 2019-09-06 NOTE — Telephone Encounter (Signed)
Spoke with pt and she is concerned that recall is not for 7 years, she is nervous since polyp was precancerous. Discussed with her that standards are followed as far as the size, type, and number of polyps in regards to recalls. Pt states she would just feel better speaking to Dr. Tarri Glenn about this and states it would be ok for her to call back next week if she is busy at the hospital.

## 2019-09-06 NOTE — Telephone Encounter (Signed)
Spoke with patient, read patient the letter that was sent to her on 08/28/19. Pt aware that she will have a repeat colonoscopy in 7 years. Recall in epic. Pt states that she will call us back if she does not receive the physical copy of the letter in the mail soon.

## 2019-09-12 ENCOUNTER — Other Ambulatory Visit: Payer: Self-pay | Admitting: Family Medicine

## 2019-09-12 DIAGNOSIS — Z1231 Encounter for screening mammogram for malignant neoplasm of breast: Secondary | ICD-10-CM

## 2019-09-12 NOTE — Telephone Encounter (Signed)
I called the patient and answered all of her questions to her satisfaction

## 2019-09-19 ENCOUNTER — Other Ambulatory Visit: Payer: Self-pay | Admitting: Family Medicine

## 2019-09-19 ENCOUNTER — Other Ambulatory Visit (HOSPITAL_COMMUNITY)
Admission: RE | Admit: 2019-09-19 | Discharge: 2019-09-19 | Disposition: A | Discharge status: Home / Self Care | Payer: Medicare HMO | Source: Ambulatory Visit | Attending: Family Medicine | Admitting: Family Medicine

## 2019-09-19 DIAGNOSIS — Z01411 Encounter for gynecological examination (general) (routine) with abnormal findings: Secondary | ICD-10-CM | POA: Insufficient documentation

## 2019-09-19 DIAGNOSIS — Z1151 Encounter for screening for human papillomavirus (HPV): Secondary | ICD-10-CM | POA: Insufficient documentation

## 2019-09-24 LAB — CYTOLOGY - PAP
Comment: NEGATIVE
Diagnosis: NEGATIVE
High risk HPV: NEGATIVE

## 2019-10-15 ENCOUNTER — Ambulatory Visit
Admission: RE | Admit: 2019-10-15 | Discharge: 2019-10-15 | Disposition: A | Discharge status: Home / Self Care | Payer: Medicare HMO | Source: Ambulatory Visit | Attending: Family Medicine | Admitting: Family Medicine

## 2019-10-15 ENCOUNTER — Other Ambulatory Visit: Payer: Self-pay

## 2019-10-15 DIAGNOSIS — Z1231 Encounter for screening mammogram for malignant neoplasm of breast: Secondary | ICD-10-CM

## 2020-09-03 ENCOUNTER — Other Ambulatory Visit: Payer: Self-pay | Admitting: Family Medicine

## 2020-09-03 DIAGNOSIS — Z1231 Encounter for screening mammogram for malignant neoplasm of breast: Secondary | ICD-10-CM

## 2020-09-11 ENCOUNTER — Other Ambulatory Visit: Payer: Self-pay | Admitting: Family Medicine

## 2020-09-11 DIAGNOSIS — E2839 Other primary ovarian failure: Secondary | ICD-10-CM

## 2020-10-23 ENCOUNTER — Encounter: Payer: Self-pay | Admitting: Podiatry

## 2020-10-23 ENCOUNTER — Other Ambulatory Visit: Payer: Self-pay

## 2020-10-23 ENCOUNTER — Ambulatory Visit: Payer: Medicare HMO | Admitting: Podiatry

## 2020-10-23 DIAGNOSIS — B351 Tinea unguium: Secondary | ICD-10-CM | POA: Diagnosis not present

## 2020-10-25 ENCOUNTER — Other Ambulatory Visit: Payer: Self-pay

## 2020-10-25 ENCOUNTER — Ambulatory Visit
Admission: RE | Admit: 2020-10-25 | Discharge: 2020-10-25 | Disposition: A | Discharge status: Home / Self Care | Payer: Medicare HMO | Source: Ambulatory Visit | Attending: Family Medicine | Admitting: Family Medicine

## 2020-10-25 DIAGNOSIS — Z1231 Encounter for screening mammogram for malignant neoplasm of breast: Secondary | ICD-10-CM

## 2020-10-28 ENCOUNTER — Ambulatory Visit: Payer: Medicare HMO

## 2020-10-28 ENCOUNTER — Encounter: Payer: Self-pay | Admitting: Podiatry

## 2020-10-28 NOTE — Progress Notes (Signed)
  Subjective:  Patient ID: Connie Lozano, female    DOB: [birth year],  MRN: [id number]  Chief Complaint  Patient presents with   Nail Problem       bilat discoloration of toenails    65 y.o. female presents with the above complaint. History confirmed with patient.  Has had discoloration of the nails and is concerned that it may be fungus  Objective:  Physical Exam: warm, good capillary refill, no trophic changes or ulcerative lesions, normal DP and PT pulses, and normal sensory exam.  Dystrophy of the toenails with some discoloration Assessment:   1. Onychomycosis      Plan:  Patient was evaluated and treated and all questions answered.  Discussed with her its unclear that if this is onychomycosis versus onychodystrophy versus age-related nail changes.  Recommended biopsy and culture of the nail plate today which I took this to be sent to the lab.  We will get her results I will discuss with her further  Return if symptoms worsen or fail to improve.

## 2021-03-30 ENCOUNTER — Ambulatory Visit
Admission: RE | Admit: 2021-03-30 | Discharge: 2021-03-30 | Disposition: A | Discharge status: Home / Self Care | Payer: Medicare HMO | Source: Ambulatory Visit | Attending: Family Medicine | Admitting: Family Medicine

## 2021-03-30 DIAGNOSIS — E2839 Other primary ovarian failure: Secondary | ICD-10-CM

## 2021-04-06 IMAGING — MG DIGITAL SCREENING BILAT W/ TOMO W/ CAD
8 series · 9 of 24 positions shown · non-contrast
Comparison: Previous exam(s).

CLINICAL DATA: Screening.

EXAM:
DIGITAL SCREENING BILATERAL MAMMOGRAM WITH TOMO AND CAD

[R CC synth-2D]
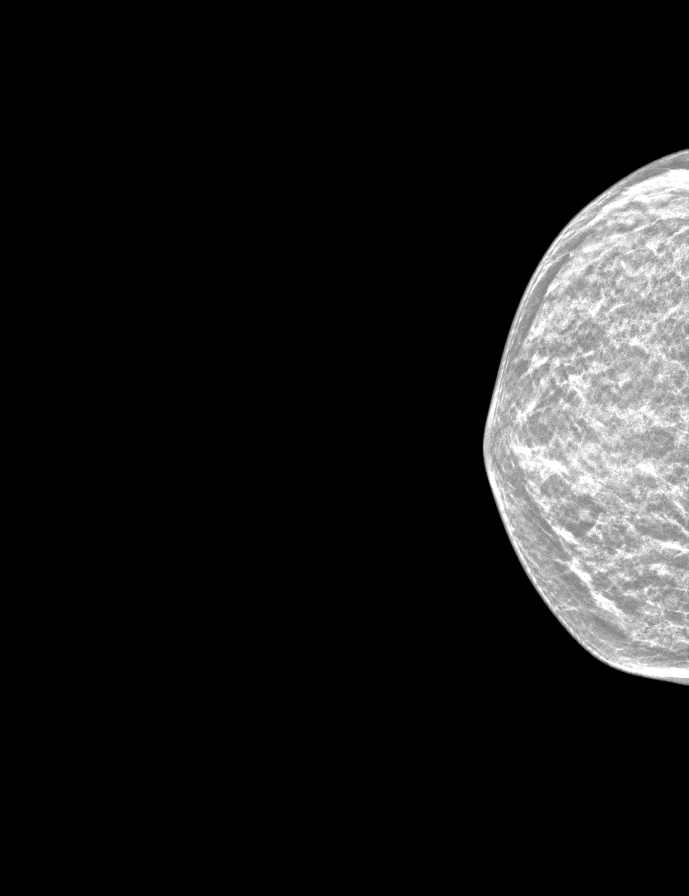

[L MLO synth-2D]
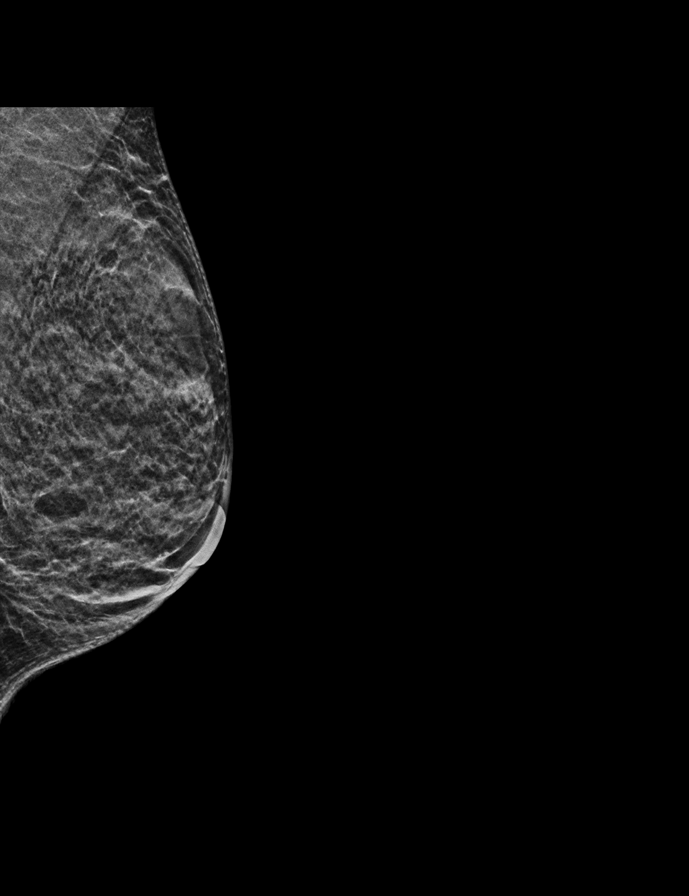

[R MLO synth-2D]
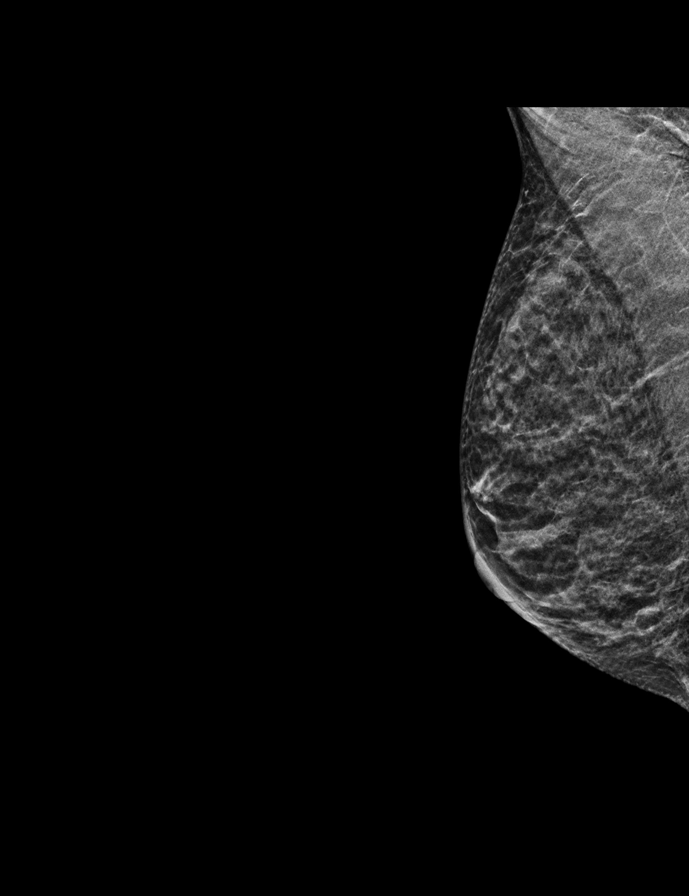

[L CC synth-2D]
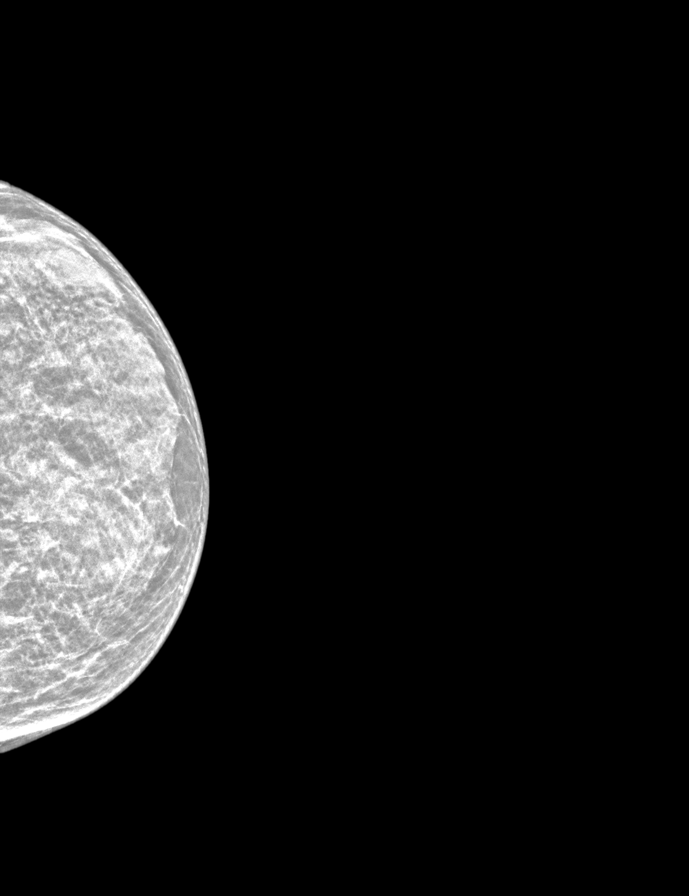

[R MLO tomo · 2 of 28 frames shown]
[frame 10/28]
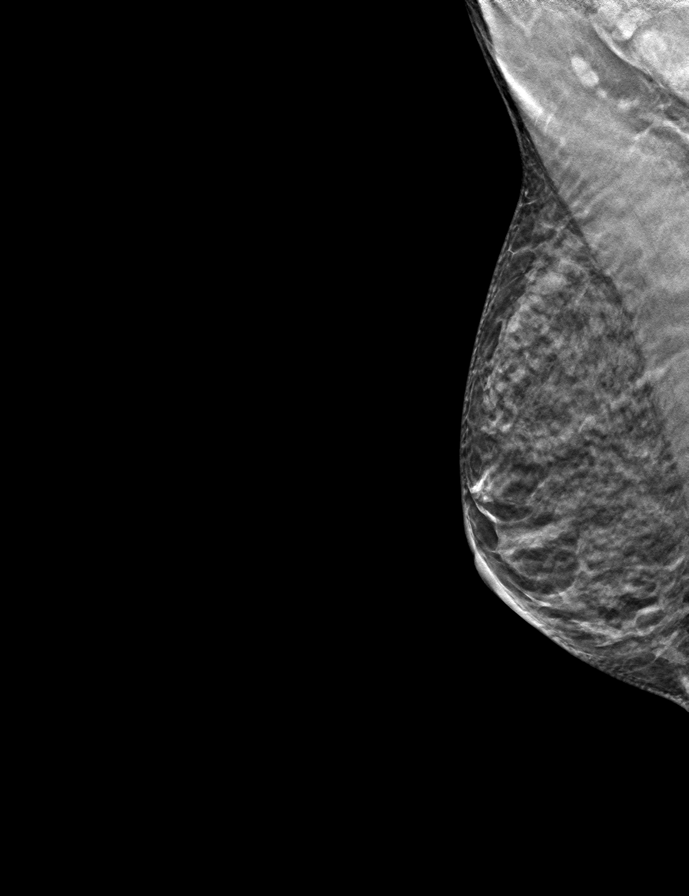
[frame 15/28]
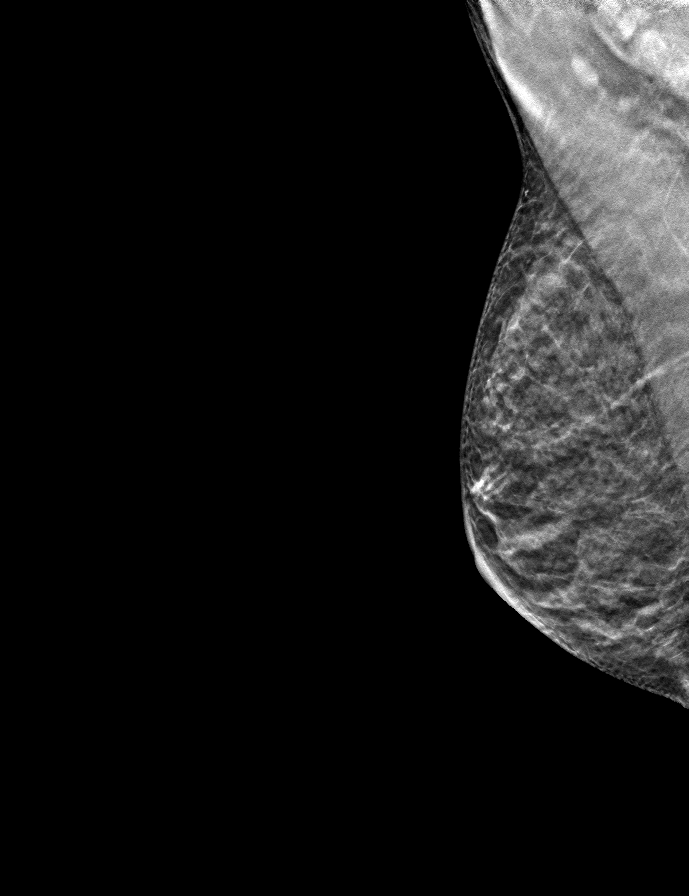

[R CC tomo · tomo slice 13/26.0]
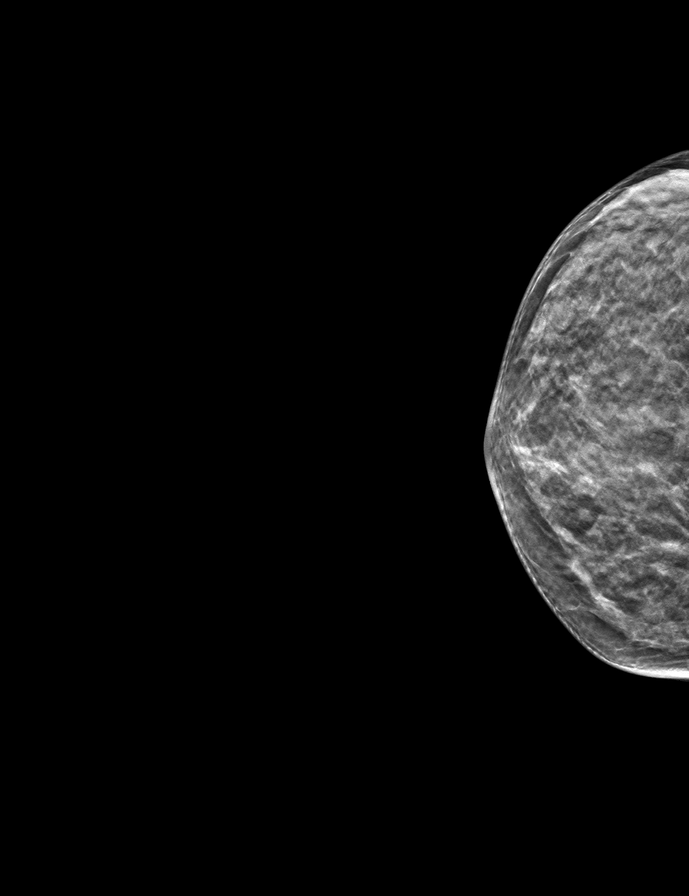

[L MLO tomo · tomo slice 13/25.0]
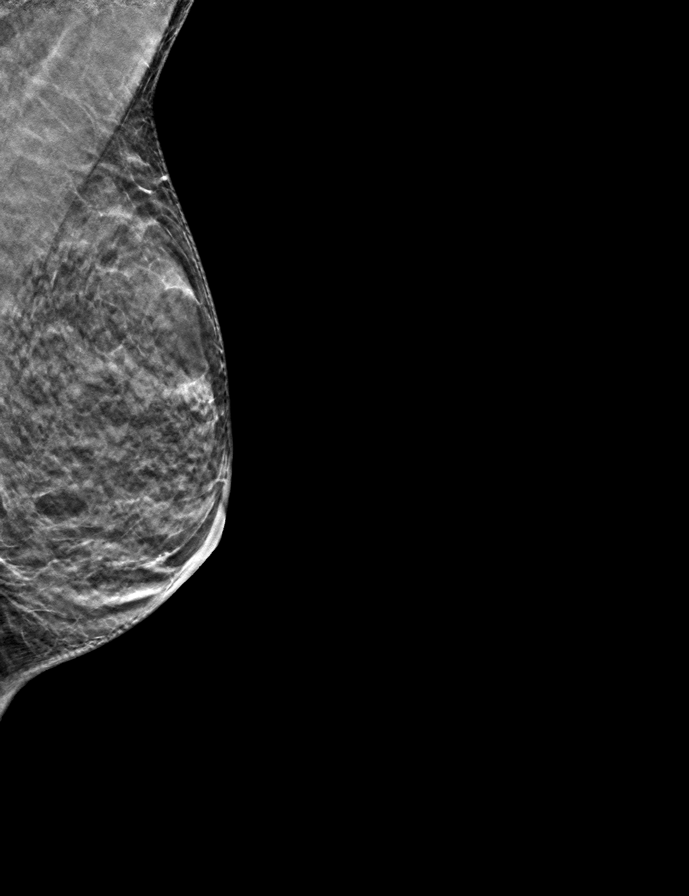

[L CC tomo · tomo slice 14/27.0]
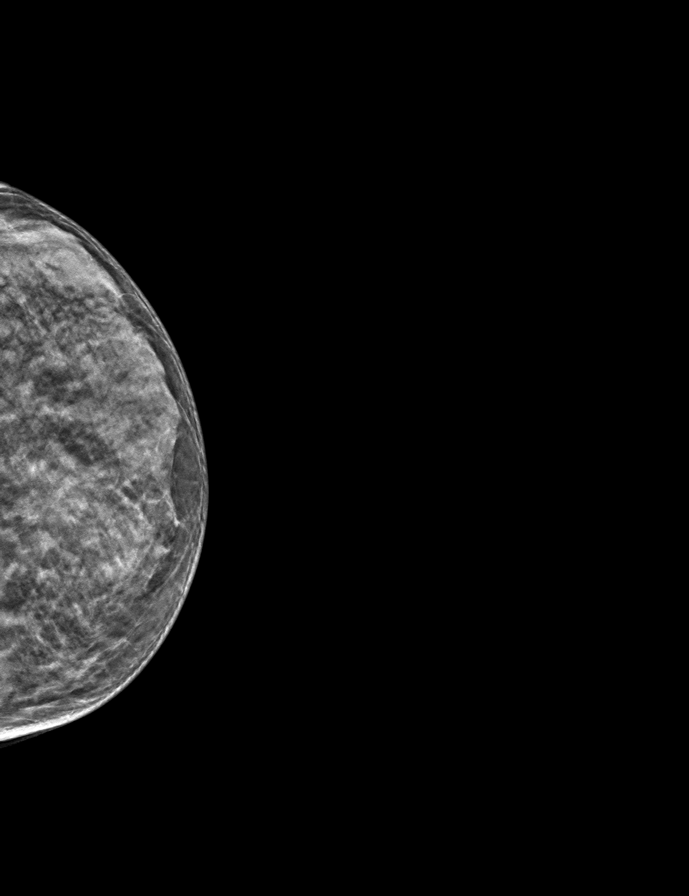

[9 of 24 positions shown; findings below may reference images not displayed]

ACR Breast Density Category c: The breast tissue is heterogeneously
dense, which may obscure small masses.
FINDINGS: There are no findings suspicious for malignancy. Images were
processed with CAD.
IMPRESSION: No mammographic evidence of malignancy. A result letter of this
screening mammogram will be mailed directly to the patient.

RECOMMENDATION:
Screening mammogram in one year. (Code:[id number])

BI-RADS CATEGORY  1: Negative.

## 2021-07-23 ENCOUNTER — Ambulatory Visit: Payer: Medicare HMO | Admitting: Podiatry

## 2021-07-23 ENCOUNTER — Ambulatory Visit (INDEPENDENT_AMBULATORY_CARE_PROVIDER_SITE_OTHER): Payer: Medicare HMO

## 2021-07-23 ENCOUNTER — Encounter: Payer: Self-pay | Admitting: Podiatry

## 2021-07-23 DIAGNOSIS — M7752 Other enthesopathy of left foot: Secondary | ICD-10-CM

## 2021-07-23 DIAGNOSIS — B351 Tinea unguium: Secondary | ICD-10-CM

## 2021-07-23 NOTE — Progress Notes (Signed)
SITUATION Reason for Consult: Evaluation for Bilateral Custom Foot Orthoses Patient / Caregiver Report: Patient is ready for foot orthotics  OBJECTIVE DATA: Patient History / Diagnosis:    ICD-10-CM   1. Onychomycosis  B35.1       Current or Previous Devices:   Over the counter  Foot Examination: Skin presentation:   Intact Ulcers & Callousing:   None Toe / Foot Deformities:  Prominent metatarsals Weight Bearing Presentation:  Rectus Sensation:    Intact  Shoe Size:    8.5  ORTHOTIC RECOMMENDATION Recommended Device: 1x pair of custom functional foot orthotics  GOALS OF ORTHOSES - Reduce Pain - Prevent Foot Deformity - Prevent Progression of Further Foot Deformity - Relieve Pressure - Improve the Overall Biomechanical Function of the Foot and Lower Extremity.  ACTIONS PERFORMED Potential out of pocket cost was communicated to patient. Patient understood and consent to casting. Patient was casted for Foot Orthoses via crush box. Procedure was explained and patient tolerated procedure well. Casts were shipped to central fabrication. All questions were answered and concerns addressed.  PLAN Patient is to be called for fitting when devices are ready.

## 2021-07-24 NOTE — Progress Notes (Signed)
Subjective:   Patient ID: Connie Lozano, female   DOB: 66 y.o.   MRN: [id number]   HPI Patient presents with relative chronic foot pain and history of orthotics and also thick nailbeds that she is concerned about from a fungal perspective   ROS      Objective:  Physical Exam  Neurovascular status intact with patient found to have digital deformities of the lesser toes with thick nailbeds that occur which appear to be more due to the rotation of the digits themselves.  Patient is also noted to have moderate instability of her foot with forefoot inflammation around the lesser MPJs bilateral of the more chronic nature history of orthotics of both custom and over-the-counter     Assessment:  Inflammatory capsulitis condition along with trauma to the nailbed secondary to digital structure which is occurring     Plan:  H&P reviewed all conditions and with her the causes of these different issues that she is experiencing.  I do think that we are dealing here more with trauma versus actual fungus and I do not recommend culturing oral medications or topicals and spent a great deal of time trying to explain that to her.  We did casted for new functional orthotics today by pedorthist and will dispense these to her when ready

## 2021-08-11 ENCOUNTER — Ambulatory Visit: Payer: Medicare HMO

## 2021-08-11 DIAGNOSIS — M7752 Other enthesopathy of left foot: Secondary | ICD-10-CM

## 2021-08-11 NOTE — Progress Notes (Signed)
SITUATION: Reason for Visit: Fitting and Delivery of Custom Fabricated Foot Orthoses Patient Report: Patient reports comfort and is satisfied with device.  OBJECTIVE DATA: Patient History / Diagnosis:     ICD-10-CM   1. Capsulitis of metatarsophalangeal (MTP) joint of left foot  M77.52       Provided Device:  Custom Functional Foot Orthotics     [facility]: [id number]  GOAL OF ORTHOSIS - Improve gait - Decrease energy expenditure - Improve Balance - Provide Triplanar stability of foot complex - Facilitate motion  ACTIONS PERFORMED Patient was fit with foot orthotics trimmed to shoe last. Patient tolerated fittign procedure.   Patient was provided with verbal and written instruction and demonstration regarding donning, doffing, wear, care, proper fit, function, purpose, cleaning, and use of the orthosis and in all related precautions and risks and benefits regarding the orthosis.  Patient was also provided with verbal instruction regarding how to report any failures or malfunctions of the orthosis and necessary follow up care. Patient was also instructed to contact our office regarding any change in status that may affect the function of the orthosis.  Patient demonstrated independence with proper donning, doffing, and fit and verbalized understanding of all instructions.  PLAN: Patient is to follow up in one week or as necessary (PRN). All questions were answered and concerns addressed. Plan of care was discussed with and agreed upon by the patient.

## 2021-09-03 ENCOUNTER — Other Ambulatory Visit: Payer: Medicare HMO

## 2021-09-18 ENCOUNTER — Other Ambulatory Visit: Payer: Self-pay | Admitting: Family Medicine

## 2021-09-18 DIAGNOSIS — Z1231 Encounter for screening mammogram for malignant neoplasm of breast: Secondary | ICD-10-CM

## 2021-10-01 ENCOUNTER — Telehealth: Payer: Self-pay | Admitting: Podiatry

## 2021-10-01 NOTE — Telephone Encounter (Signed)
Patient wants to order dress shoe orthotics .

## 2021-10-27 ENCOUNTER — Ambulatory Visit: Payer: Medicare HMO

## 2021-11-03 ENCOUNTER — Ambulatory Visit
Admission: RE | Admit: 2021-11-03 | Discharge: 2021-11-03 | Disposition: A | Discharge status: Home / Self Care | Payer: Medicare HMO | Source: Ambulatory Visit | Attending: Family Medicine | Admitting: Family Medicine

## 2021-11-03 DIAGNOSIS — Z1231 Encounter for screening mammogram for malignant neoplasm of breast: Secondary | ICD-10-CM

## 2021-11-13 NOTE — Telephone Encounter (Signed)
Pt informed that the order has been placed and we will give her a call when they arrive.

## 2021-12-21 ENCOUNTER — Ambulatory Visit: Payer: Medicare HMO | Admitting: *Deleted

## 2021-12-21 DIAGNOSIS — M7752 Other enthesopathy of left foot: Secondary | ICD-10-CM

## 2021-12-21 DIAGNOSIS — B351 Tinea unguium: Secondary | ICD-10-CM

## 2021-12-21 NOTE — Progress Notes (Cosign Needed)
Patient presents today to pick up custom molded foot orthotics recommended by Dr. Paulla Dolly.   Orthotics were dispensed and fit was satisfactory. Reviewed instructions for break-in and wear. Written instructions given to patient.  Patient will follow up as needed.   [facility] - order # [id number]

## 2022-10-08 ENCOUNTER — Other Ambulatory Visit: Payer: Self-pay | Admitting: Family Medicine

## 2022-10-08 DIAGNOSIS — Z1231 Encounter for screening mammogram for malignant neoplasm of breast: Secondary | ICD-10-CM

## 2022-11-09 ENCOUNTER — Ambulatory Visit: Payer: Medicare HMO

## 2022-11-16 ENCOUNTER — Ambulatory Visit
Admission: RE | Admit: 2022-11-16 | Discharge: 2022-11-16 | Disposition: A | Discharge status: Home / Self Care | Payer: Medicare HMO | Source: Ambulatory Visit | Attending: Family Medicine

## 2022-11-16 DIAGNOSIS — Z1231 Encounter for screening mammogram for malignant neoplasm of breast: Secondary | ICD-10-CM

## 2023-07-20 ENCOUNTER — Encounter: Payer: Self-pay | Admitting: Podiatry

## 2023-07-20 ENCOUNTER — Ambulatory Visit: Admitting: Podiatry

## 2023-07-20 VITALS — Ht 62.0 in | Wt 94.0 lb

## 2023-07-20 DIAGNOSIS — M2041 Other hammer toe(s) (acquired), right foot: Secondary | ICD-10-CM | POA: Diagnosis not present

## 2023-07-20 DIAGNOSIS — M2042 Other hammer toe(s) (acquired), left foot: Secondary | ICD-10-CM | POA: Diagnosis not present

## 2023-07-20 DIAGNOSIS — L600 Ingrowing nail: Secondary | ICD-10-CM

## 2023-07-21 NOTE — Progress Notes (Signed)
 Subjective:   Patient ID: Connie Lozano, female   DOB: 68 y.o.   MRN: [id number]   HPI Patient presents with concerns about nail disease and the possibility that she may have had trauma and concerns about what this means for future   ROS      Objective:  Physical Exam  Neuro vascular status intact history of capsulitis left that she also has questions of stating overall it is doing well with inserts with nail disease of the second bilateral which appears to be more acute and has been present for several months     Assessment:  Inflammatory capsulitis left along with nail disease and slight thickness of the second nailbed right over left with slight discoloration     Plan:  H&P reviewed condition went ahead today and discussed trauma versus fungus and I think this is more trauma keeping the nail trimmed with the possibility for nail removal and at 1 point we could consider laser oral medicine depending on how it does.  Advised on letting it grow out at this time continue rigid bottom shoes orthotics for the chronic MPJ irritation

## 2023-10-03 ENCOUNTER — Other Ambulatory Visit: Payer: Self-pay | Admitting: Family Medicine

## 2023-10-03 DIAGNOSIS — Z1231 Encounter for screening mammogram for malignant neoplasm of breast: Secondary | ICD-10-CM

## 2023-10-26 ENCOUNTER — Other Ambulatory Visit (HOSPITAL_COMMUNITY): Payer: Self-pay | Admitting: Family Medicine

## 2023-10-26 ENCOUNTER — Other Ambulatory Visit (HOSPITAL_BASED_OUTPATIENT_CLINIC_OR_DEPARTMENT_OTHER): Payer: Self-pay | Admitting: Family Medicine

## 2023-10-26 ENCOUNTER — Ambulatory Visit (HOSPITAL_COMMUNITY)
Admission: RE | Admit: 2023-10-26 | Discharge: 2023-10-26 | Disposition: A | Discharge status: Home / Self Care | Source: Ambulatory Visit | Attending: Vascular Surgery | Admitting: Vascular Surgery

## 2023-10-26 DIAGNOSIS — I739 Peripheral vascular disease, unspecified: Secondary | ICD-10-CM | POA: Diagnosis present

## 2023-10-26 DIAGNOSIS — M810 Age-related osteoporosis without current pathological fracture: Secondary | ICD-10-CM

## 2023-10-26 LAB — VAS US ABI WITH/WO TBI
Left ABI: 0.17
Right ABI: 1.15

## 2023-10-27 ENCOUNTER — Encounter (HOSPITAL_COMMUNITY)

## 2023-11-18 ENCOUNTER — Ambulatory Visit
Admission: RE | Admit: 2023-11-18 | Discharge: 2023-11-18 | Disposition: A | Discharge status: Home / Self Care | Source: Ambulatory Visit | Attending: Family Medicine

## 2023-11-18 DIAGNOSIS — Z1231 Encounter for screening mammogram for malignant neoplasm of breast: Secondary | ICD-10-CM

## 2024-05-28 ENCOUNTER — Ambulatory Visit (HOSPITAL_BASED_OUTPATIENT_CLINIC_OR_DEPARTMENT_OTHER)
